# Patient Record
Sex: Male | Born: 1970 | Race: White | Hispanic: No | Marital: Married | Smoking: Former smoker
Health system: Southern US, Community
[De-identification: ages and names within clinical notes are randomized; demographics above are authoritative.]

## PROBLEM LIST (undated history)

## (undated) DIAGNOSIS — K5792 Diverticulitis of intestine, part unspecified, without perforation or abscess without bleeding: Secondary | ICD-10-CM

## (undated) DIAGNOSIS — I2119 ST elevation (STEMI) myocardial infarction involving other coronary artery of inferior wall: Secondary | ICD-10-CM

## (undated) DIAGNOSIS — I2510 Atherosclerotic heart disease of native coronary artery without angina pectoris: Secondary | ICD-10-CM

## (undated) DIAGNOSIS — Z95810 Presence of automatic (implantable) cardiac defibrillator: Secondary | ICD-10-CM

## (undated) DIAGNOSIS — E669 Obesity, unspecified: Secondary | ICD-10-CM

## (undated) DIAGNOSIS — I739 Peripheral vascular disease, unspecified: Secondary | ICD-10-CM

## (undated) DIAGNOSIS — I255 Ischemic cardiomyopathy: Secondary | ICD-10-CM

## (undated) DIAGNOSIS — G4733 Obstructive sleep apnea (adult) (pediatric): Secondary | ICD-10-CM

## (undated) DIAGNOSIS — F319 Bipolar disorder, unspecified: Secondary | ICD-10-CM

## (undated) DIAGNOSIS — I779 Disorder of arteries and arterioles, unspecified: Secondary | ICD-10-CM

## (undated) DIAGNOSIS — N179 Acute kidney failure, unspecified: Secondary | ICD-10-CM

## (undated) HISTORY — DX: Obstructive sleep apnea (adult) (pediatric): G47.33

## (undated) HISTORY — PX: OTHER SURGICAL HISTORY: SHX169

## (undated) HISTORY — DX: Obesity, unspecified: E66.9

## (undated) HISTORY — DX: Bipolar disorder, unspecified: F31.9

## (undated) HISTORY — DX: Ischemic cardiomyopathy: I25.5

## (undated) HISTORY — DX: Diverticulitis of intestine, part unspecified, without perforation or abscess without bleeding: K57.92

## (undated) HISTORY — DX: Atherosclerotic heart disease of native coronary artery without angina pectoris: I25.10

---

## 1998-11-04 DIAGNOSIS — I2119 ST elevation (STEMI) myocardial infarction involving other coronary artery of inferior wall: Secondary | ICD-10-CM

## 1998-11-04 DIAGNOSIS — I2510 Atherosclerotic heart disease of native coronary artery without angina pectoris: Secondary | ICD-10-CM

## 1998-11-04 HISTORY — DX: Atherosclerotic heart disease of native coronary artery without angina pectoris: I25.10

## 1998-11-04 HISTORY — PX: CORONARY ANGIOPLASTY WITH STENT PLACEMENT: SHX49

## 1998-11-04 HISTORY — DX: ST elevation (STEMI) myocardial infarction involving other coronary artery of inferior wall: I21.19

## 2013-10-20 ENCOUNTER — Ambulatory Visit (INDEPENDENT_AMBULATORY_CARE_PROVIDER_SITE_OTHER): Payer: BC Managed Care – PPO | Admitting: Cardiology

## 2013-10-20 ENCOUNTER — Encounter: Payer: Self-pay | Admitting: *Deleted

## 2013-10-20 ENCOUNTER — Encounter: Payer: Self-pay | Admitting: Cardiology

## 2013-10-20 VITALS — BP 130/80 | HR 76 | Ht 73.0 in | Wt 312.0 lb

## 2013-10-20 DIAGNOSIS — G4733 Obstructive sleep apnea (adult) (pediatric): Secondary | ICD-10-CM | POA: Insufficient documentation

## 2013-10-20 DIAGNOSIS — I429 Cardiomyopathy, unspecified: Secondary | ICD-10-CM | POA: Insufficient documentation

## 2013-10-20 DIAGNOSIS — Z9861 Coronary angioplasty status: Secondary | ICD-10-CM

## 2013-10-20 DIAGNOSIS — I2510 Atherosclerotic heart disease of native coronary artery without angina pectoris: Secondary | ICD-10-CM | POA: Insufficient documentation

## 2013-10-20 DIAGNOSIS — I2589 Other forms of chronic ischemic heart disease: Secondary | ICD-10-CM

## 2013-10-20 DIAGNOSIS — I255 Ischemic cardiomyopathy: Secondary | ICD-10-CM

## 2013-10-20 DIAGNOSIS — F319 Bipolar disorder, unspecified: Secondary | ICD-10-CM | POA: Insufficient documentation

## 2013-10-20 DIAGNOSIS — E669 Obesity, unspecified: Secondary | ICD-10-CM | POA: Insufficient documentation

## 2013-10-20 DIAGNOSIS — E785 Hyperlipidemia, unspecified: Secondary | ICD-10-CM

## 2013-10-20 MED ORDER — LISINOPRIL 2.5 MG PO TABS: 2.5000 mg | ORAL_TABLET | Freq: Every day | ORAL | Status: DC

## 2013-10-20 NOTE — Assessment & Plan Note (Signed)
Discussed weight loss. 

## 2013-10-20 NOTE — Assessment & Plan Note (Signed)
Based on outside records patient's LV function is reduced. Continue carvedilol at present dose. Discontinue Imdur as his blood pressure apparently is borderline at home. Add lisinopril 2.5 mg daily. Check potassium and renal function in one week. Schedule echocardiogram to assess LV function. If ejection fraction less than 35% would need to consider ICD.

## 2013-10-20 NOTE — Patient Instructions (Signed)
Your physician wants you to follow-up in: 6 MONTHS WITH DR Jens Som You will receive a reminder letter in the mail two months in advance. If you don't receive a letter, please call our office to schedule the follow-up appointment.   STOP ISOSORBIDE  START LISINOPRIL 2.5 MG ONCE DAILY  Your physician recommends that you return for lab work in: ONE WEEK- DO NOT EAT PRIOR TO LAB WORK  Your physician has requested that you have en exercise stress myoview. For further information please visit [email or web address] Please follow instruction sheet, as given.

## 2013-10-20 NOTE — Assessment & Plan Note (Signed)
Continue aspirin and statin. Schedule Myoview for risk stratification. 

## 2013-10-20 NOTE — Assessment & Plan Note (Signed)
Continue statin. Check lipids and liver. 

## 2013-10-20 NOTE — Progress Notes (Signed)
HPI: 42 year old male for evaluation of coronary artery disease. Patient previously cared for in [facility]. Patient has had a prior inferior posterior myocardial infarction. Cardiac catheterization in 2000 showed an occluded right coronary artery and patient had PCI at that time. Ejection fraction was 50%. Echocardiogram in 2009 showed an ejection fraction of 35-40% with akinesis of the inferior, inferior lateral and inferior septal walls. Moderate left atrial enlargement and mild mitral regurgitation. Previous monitor showed frequent PVCs. Carotid Dopplers in January 2010 showed less than 50% bilateral stenosis. Nuclear stress test in July of 2011 showed an ejection fraction of 34%. There was scar in the inferior apical wall. No ischemia. Patient denies dyspnea, chest pain, palpitations or syncope. No pedal edema. No claudication.  Current Outpatient Prescriptions  Medication Sig Dispense Refill  . aspirin 325 MG tablet Take 325 mg by mouth daily.      Marland Kitchen buPROPion (WELLBUTRIN XL) 150 MG 24 hr tablet Take 3 tablets by mouth daily.       . carvedilol (COREG) 25 MG tablet Take 1 tablet by mouth 2 (two) times daily.      . clonazePAM (KLONOPIN) 0.5 MG tablet Take 1 tablet by mouth daily.      Marland Kitchen FOLBEE 2.5-25-1 MG TABS tablet Take 1 tablet by mouth daily.      . furosemide (LASIX) 40 MG tablet Take 1 tablet by mouth as needed.       . isosorbide mononitrate (IMDUR) 30 MG 24 hr tablet 1/2 tab po qd      . lamoTRIgine (LAMICTAL) 200 MG tablet Take 1 tablet by mouth daily.      . modafinil (PROVIGIL) 200 MG tablet Take 1 tablet by mouth daily.      . nitroGLYCERIN (NITROSTAT) 0.4 MG SL tablet Place 0.4 mg under the tongue every 5 (five) minutes as needed for chest pain.      Marland Kitchen risperiDONE (RISPERDAL) 1 MG tablet Take 1 tablet by mouth daily.      . simvastatin (ZOCOR) 80 MG tablet Take 1 tablet by mouth daily.       No current facility-administered medications for this visit.    No Known  Allergies  Past Medical History  Diagnosis Date  . CAD (coronary artery disease)   . OSA (obstructive sleep apnea)   . Obesity   . Bipolar disorder   . Diverticulitis     Past Surgical History  Procedure Laterality Date  . Right elbow surgery      History   Social History  . Marital Status: Married    Spouse Name: N/A    Number of Children: 2  . Years of Education: N/A   Occupational History  .     Social History Main Topics  . Smoking status: Former Games developer  . Smokeless tobacco: Not on file  . Alcohol Use: Yes     Comment: Rarely  . Drug Use: Not on file  . Sexual Activity: Not on file   Other Topics Concern  . Not on file   Social History Narrative  . No narrative on file    Family History  Problem Relation Age of Onset  . CAD Mother     MI at age 32    ROS: no fevers or chills, productive cough, hemoptysis, dysphasia, odynophagia, melena, hematochezia, dysuria, hematuria, rash, seizure activity, orthopnea, PND, pedal edema, claudication. Remaining systems are negative.  Physical Exam:   Blood pressure 130/80, pulse 76, height 6\' 1"  (1.854 m),  weight 312 lb (141.522 kg).  General:  Well developed/obese in NAD Skin warm/dry Patient not depressed No peripheral clubbing Back-normal HEENT-normal/normal eyelids Neck supple/normal carotid upstroke bilaterally; no bruits; no JVD; no thyromegaly chest - CTA/ normal expansion CV - RRR/normal S1 and S2; no murmurs, rubs or gallops;  PMI nondisplaced Abdomen -NT/ND, no HSM, no mass, + bowel sounds, no bruit 2+ femoral pulses, no bruits Ext-trace edema, no chords, 2+ DP Neuro-grossly nonfocal  ECG sinus rhythm at a rate of 76. Prior inferior lateral infarct.

## 2013-10-25 ENCOUNTER — Other Ambulatory Visit: Payer: Self-pay | Admitting: *Deleted

## 2013-10-25 MED ORDER — SIMVASTATIN 80 MG PO TABS: 80.0000 mg | ORAL_TABLET | Freq: Every day | ORAL | Status: DC

## 2013-10-25 MED ORDER — FOLIC ACID-VIT B6-VIT B12 2.5-25-1 MG PO TABS: 1.0000 | ORAL_TABLET | Freq: Every day | ORAL | Status: DC

## 2013-10-25 MED ORDER — CARVEDILOL 25 MG PO TABS: 25.0000 mg | ORAL_TABLET | Freq: Two times a day (BID) | ORAL | Status: DC

## 2013-10-26 LAB — BASIC METABOLIC PANEL WITH GFR
CO2: 26 mEq/L (ref 19–32)
Chloride: 103 mEq/L (ref 96–112)
Glucose, Bld: 121 mg/dL — ABNORMAL HIGH (ref 70–99)
Potassium: 4.1 mEq/L (ref 3.5–5.3)
Sodium: 138 mEq/L (ref 135–145)

## 2013-10-26 LAB — HEPATIC FUNCTION PANEL
Albumin: 4 g/dL (ref 3.5–5.2)
Total Protein: 6.2 g/dL (ref 6.0–8.3)

## 2013-10-26 LAB — LIPID PANEL
HDL: 34 mg/dL — ABNORMAL LOW (ref >39)
LDL Cholesterol: 46 mg/dL (ref 0–99)
Total CHOL/HDL Ratio: 3.1 Ratio
Triglycerides: 135 mg/dL (ref <150)
VLDL: 27 mg/dL (ref 0–40)

## 2013-10-27 ENCOUNTER — Encounter: Payer: Self-pay | Admitting: *Deleted

## 2013-11-15 ENCOUNTER — Encounter: Payer: Self-pay | Admitting: Cardiology

## 2013-11-15 ENCOUNTER — Ambulatory Visit (HOSPITAL_COMMUNITY): Payer: BC Managed Care – PPO | Attending: Cardiology | Admitting: Radiology

## 2013-11-15 VITALS — BP 104/72 | HR 59 | Ht 73.0 in | Wt 303.0 lb

## 2013-11-15 DIAGNOSIS — I779 Disorder of arteries and arterioles, unspecified: Secondary | ICD-10-CM | POA: Insufficient documentation

## 2013-11-15 DIAGNOSIS — R002 Palpitations: Secondary | ICD-10-CM | POA: Insufficient documentation

## 2013-11-15 DIAGNOSIS — R0602 Shortness of breath: Secondary | ICD-10-CM | POA: Insufficient documentation

## 2013-11-15 DIAGNOSIS — I255 Ischemic cardiomyopathy: Secondary | ICD-10-CM

## 2013-11-15 DIAGNOSIS — I252 Old myocardial infarction: Secondary | ICD-10-CM | POA: Insufficient documentation

## 2013-11-15 DIAGNOSIS — Z8249 Family history of ischemic heart disease and other diseases of the circulatory system: Secondary | ICD-10-CM | POA: Insufficient documentation

## 2013-11-15 DIAGNOSIS — Z87891 Personal history of nicotine dependence: Secondary | ICD-10-CM | POA: Insufficient documentation

## 2013-11-15 DIAGNOSIS — I2510 Atherosclerotic heart disease of native coronary artery without angina pectoris: Secondary | ICD-10-CM

## 2013-11-15 DIAGNOSIS — E785 Hyperlipidemia, unspecified: Secondary | ICD-10-CM | POA: Insufficient documentation

## 2013-11-15 DIAGNOSIS — R0609 Other forms of dyspnea: Secondary | ICD-10-CM | POA: Insufficient documentation

## 2013-11-15 DIAGNOSIS — R0989 Other specified symptoms and signs involving the circulatory and respiratory systems: Principal | ICD-10-CM | POA: Insufficient documentation

## 2013-11-15 MED ORDER — REGADENOSON 0.4 MG/5ML IV SOLN
0.4000 mg | Freq: Once | INTRAVENOUS | Status: AC
  Administered 2013-11-15: 0.4 mg via INTRAVENOUS

## 2013-11-15 MED ORDER — TECHNETIUM TC 99M SESTAMIBI GENERIC - CARDIOLITE
30.0000 | Freq: Once | INTRAVENOUS | Status: AC | PRN
  Administered 2013-11-15: 30 via INTRAVENOUS

## 2013-11-15 NOTE — Progress Notes (Signed)
[facility] SITE 3 NUCLEAR MED [address] [place], [place] [address] [phone]    [facility]  Todd SakeJoseph Jackson is a 43 y.o. male     MRN : [id number]     DOB: 1971/08/21  Procedure Date: 11/15/2013  Nuclear Med Background Indication for Stress Test:  Evaluation for Ischemia, Stent Patency and Assess EF for possible ICD History:  MI, '02 RCA Stent, '09 Echo: EF=35-40%, and '11 Myocardial Perfusion Imaging-No ischemia, but infero-apical scar, EF=34% Cardiac Risk Factors: Carotid Disease, Family History - CAD, History of Smoking and Lipids  Symptoms:  DOE, Palpitations and SOB   Nuclear Pre-Procedure Caffeine/Decaff Intake:  11:00pm NPO After: 11:00pm   Lungs:  clear O2 Sat: 97% on room air. IV 0.9% NS with Angio Cath:  22g  IV Site: R Hand  IV Started by:  Cathlyn Parsonsynthia Hasspacher, RN  Chest Size (in):  52 Cup Size: n/a  Height: 6\' 1"  (1.854 m)  Weight:  303 lb (137.44 kg)  BMI:  Body mass index is 39.98 kg/(m^2). Tech Comments:  Coreg taken at 0700 this am    Nuclear Med Study 1 or 2 day study: 2 day  Stress Test Type:  Treadmill/Lexiscan  Reading MD: n/a  Order Authorizing Provider:  Ripley FraiseBrian Charae Depaolis,MD  Resting Radionuclide: Technetium 5730m Sestamibi  Resting Radionuclide Dose: 33.0 mCi  11/16/13  Stress Radionuclide:  Technetium 2130m Sestamibi  Stress Radionuclide Dose: 33.0 mCi  11/15/13          Stress Protocol Rest HR: 59 Stress HR: 110  Rest BP: 104/72 Stress BP: 123/69  Exercise Time (min): 2:00 METS: n/a   Predicted Max HR: 176 bpm % Max HR: 62.5 bpm Rate Pressure Product: [id number]   Dose of Adenosine (mg):  n/a Dose of Lexiscan: 0.4 mg  Dose of Atropine (mg): n/a Dose of Dobutamine: n/a mcg/kg/min (at max HR)  Stress Test Technologist: Irean HongPatsy Edwards, RN  Nuclear Technologist:  Doyne Keelonya Yount, CNMT     Rest Procedure:  Myocardial perfusion imaging was performed at rest 45 minutes following the intravenous administration of Technetium 1330m  Sestamibi. Rest ECG: Sinus bradycardia; inferior infarct.  Stress Procedure:  The patient received IV Lexiscan 0.4 mg over 15-seconds with concurrent low level exercise and then Technetium 630m Sestamibi was injected at 30-seconds while the patient continued walking one more minute.The patient complained of mild DOE,but denied chest pain.  Quantitative spect images were obtained after a 45-minute delay. Stress ECG: No significant ST segment change suggestive of ischemia.  QPS Raw Data Images:  Acquisition technically good; LVE Stress Images:  There is decreased uptake in the inferior wall. Rest Images:  There is decreased uptake in the inferior wall. Subtraction (SDS):  There is a fixed defect that is most consistent with a previous infarction. Transient Ischemic Dilatation (Normal <1.22):  1.05 Lung/Heart Ratio (Normal <0.45):  0.31  Quantitative Gated Spect Images QGS EDV:  198 ml QGS ESV:  131 ml  Impression Exercise Capacity:  Lexiscan with low level exercise. BP Response:  Normal blood pressure response. Clinical Symptoms:  There is dyspnea. ECG Impression:  No significant ST segment change suggestive of ischemia. Comparison with Prior Nuclear Study: No images to compare  Overall Impression:  High risk stress nuclear study with a large, severe intensity, fixed inferior defect consistent with prior inferior infarct; no ischemia; study high risk due to decreased LV function.  LV Ejection Fraction: 34%.  LV Wall Motion:  Inferior akinesis.   Olga MillersBrian Mana Morison

## 2013-11-16 ENCOUNTER — Encounter: Payer: Self-pay | Admitting: Cardiovascular Disease

## 2013-11-16 ENCOUNTER — Ambulatory Visit (HOSPITAL_COMMUNITY): Payer: BC Managed Care – PPO | Attending: Cardiovascular Disease

## 2013-11-16 DIAGNOSIS — R0989 Other specified symptoms and signs involving the circulatory and respiratory systems: Secondary | ICD-10-CM

## 2013-11-16 MED ORDER — TECHNETIUM TC 99M SESTAMIBI GENERIC - CARDIOLITE
33.0000 | Freq: Once | INTRAVENOUS | Status: AC | PRN
  Administered 2013-11-16: 33 via INTRAVENOUS

## 2013-11-29 ENCOUNTER — Encounter: Payer: Self-pay | Admitting: Cardiology

## 2013-11-29 ENCOUNTER — Ambulatory Visit (INDEPENDENT_AMBULATORY_CARE_PROVIDER_SITE_OTHER): Payer: BC Managed Care – PPO | Admitting: Cardiology

## 2013-11-29 VITALS — BP 116/52 | HR 75 | Ht 73.0 in | Wt 307.2 lb

## 2013-11-29 DIAGNOSIS — I255 Ischemic cardiomyopathy: Secondary | ICD-10-CM

## 2013-11-29 DIAGNOSIS — E785 Hyperlipidemia, unspecified: Secondary | ICD-10-CM

## 2013-11-29 DIAGNOSIS — I2589 Other forms of chronic ischemic heart disease: Secondary | ICD-10-CM

## 2013-11-29 MED ORDER — LISINOPRIL 5 MG PO TABS: 5.0000 mg | ORAL_TABLET | Freq: Every day | ORAL | Status: DC

## 2013-11-29 NOTE — Addendum Note (Signed)
Addended by: Freddi StarrMATHIS, Harue Pribble W on: 11/29/2013 11:47 AM   Modules accepted: Orders

## 2013-11-29 NOTE — Assessment & Plan Note (Signed)
Continue statin. 

## 2013-11-29 NOTE — Assessment & Plan Note (Signed)
Continue aspirin and statin. Nuclear study showed no ischemia.

## 2013-11-29 NOTE — Patient Instructions (Signed)
Your physician wants you to follow-up in: 3 MONTHS WITH DR Jens SomRENSHAW You will receive a reminder letter in the mail two months in advance. If you don't receive a letter, please call our office to schedule the follow-up appointment.   REFERRAL TO EP TO DISCUSS ICD  INCREASE LISINOPRIL TO 5 MG ONCE DAILY  Your physician recommends that you return for lab work in: ONE WEEK

## 2013-11-29 NOTE — Progress Notes (Signed)
HPI: FU coronary artery disease. [facility] Patient has had a prior inferior posterior myocardial infarction. Cardiac catheterization in 2000 showed an occluded right coronary artery and patient had PCI at that time. Ejection fraction was 50%. Echocardiogram in 2009 showed an ejection fraction of 35-40% with akinesis of the inferior, inferior lateral and inferior septal walls. Moderate left atrial enlargement and mild mitral regurgitation. Previous monitor showed frequent PVCs. Carotid Dopplers in January 2010 showed less than 50% bilateral stenosis. Nuclear stress test in January of 2015 showed a prior inferior infarct with no ischemia.  Ejection fraction 34%. Since I saw him, the patient has dyspnea with more extreme activities but not with routine activities. It is relieved with rest. It is not associated with chest pain. There is no orthopnea, PND or pedal edema. There is no syncope or palpitations. There is no exertional chest pain.    Current Outpatient Prescriptions  Medication Sig Dispense Refill  . aspirin 325 MG tablet Take 325 mg by mouth daily.      Marland Kitchen. buPROPion (WELLBUTRIN XL) 150 MG 24 hr tablet Take 3 tablets by mouth daily.       . carvedilol (COREG) 25 MG tablet Take 1 tablet (25 mg total) by mouth 2 (two) times daily.  180 tablet  1  . clonazePAM (KLONOPIN) 0.5 MG tablet Take 1 tablet by mouth daily.      . Folic Acid-Vit B6-Vit B12 (FOLBEE) 2.5-25-1 MG TABS tablet Take 1 tablet by mouth daily.  90 tablet  1  . furosemide (LASIX) 40 MG tablet Take 1 tablet by mouth as needed.       . lamoTRIgine (LAMICTAL) 200 MG tablet Take 1 tablet by mouth daily.      Marland Kitchen. lisinopril (PRINIVIL,ZESTRIL) 2.5 MG tablet Take 1 tablet (2.5 mg total) by mouth daily.  90 tablet  3  . modafinil (PROVIGIL) 200 MG tablet Take 1 tablet by mouth daily.      . nitroGLYCERIN (NITROSTAT) 0.4 MG SL tablet Place 0.4 mg under the tongue every 5 (five) minutes as needed for  chest pain.      Marland Kitchen. risperiDONE (RISPERDAL) 1 MG tablet Take 1 tablet by mouth daily.      . simvastatin (ZOCOR) 80 MG tablet Take 1 tablet (80 mg total) by mouth daily.  90 tablet  1  . zaleplon (SONATA) 10 MG capsule Take 10 mg by mouth at bedtime as needed.        No current facility-administered medications for this visit.     Past Medical History  Diagnosis Date  . CAD (coronary artery disease)   . OSA (obstructive sleep apnea)   . Obesity   . Bipolar disorder   . Diverticulitis     Past Surgical History  Procedure Laterality Date  . Right elbow surgery      History   Social History  . Marital Status: Married    Spouse Name: N/A    Number of Children: 2  . Years of Education: N/A   Occupational History  .     Social History Main Topics  . Smoking status: Former Games developermoker  . Smokeless tobacco: Not on file  . Alcohol Use: Yes     Comment: Rarely  . Drug Use: Not on file  . Sexual Activity: Not on file   Other Topics Concern  . Not on file   Social History Narrative  . No narrative on file  ROS: no fevers or chills, productive cough, hemoptysis, dysphasia, odynophagia, melena, hematochezia, dysuria, hematuria, rash, seizure activity, orthopnea, PND, pedal edema, claudication. Remaining systems are negative.  Physical Exam: Well-developed obese in no acute distress.  Skin is warm and dry.  HEENT is normal.  Neck is supple.  Chest is clear to auscultation with normal expansion.  Cardiovascular exam is regular rate and rhythm.  Abdominal exam nontender or distended. No masses palpated. Extremities show no edema. neuro grossly intact

## 2013-11-29 NOTE — Assessment & Plan Note (Signed)
Continue beta blocker. Increase lisinopril to 5 mg daily. Check potassium and renal function in one week. I will consider spironolactone in the future. Ejection fraction is less than 35%. I will arrange an evaluation by electrophysiology for consideration of ICD.

## 2013-12-06 ENCOUNTER — Other Ambulatory Visit: Payer: BC Managed Care – PPO

## 2013-12-06 LAB — BASIC METABOLIC PANEL WITH GFR
BUN: 13 mg/dL (ref 6–23)
CO2: 23 meq/L (ref 19–32)
CREATININE: 1 mg/dL (ref 0.50–1.35)
Calcium: 9.2 mg/dL (ref 8.4–10.5)
Chloride: 103 mEq/L (ref 96–112)
GFR, Est African American: >89 mL/min
GFR, Est Non African American: >89 mL/min
GLUCOSE: 118 mg/dL — AB (ref 70–99)
Potassium: 4.3 mEq/L (ref 3.5–5.3)
SODIUM: 137 meq/L (ref 135–145)

## 2013-12-14 ENCOUNTER — Encounter: Payer: Self-pay | Admitting: Internal Medicine

## 2013-12-14 ENCOUNTER — Ambulatory Visit (INDEPENDENT_AMBULATORY_CARE_PROVIDER_SITE_OTHER): Payer: BC Managed Care – PPO | Admitting: Internal Medicine

## 2013-12-14 VITALS — BP 116/72 | HR 68 | Ht 73.0 in | Wt 305.4 lb

## 2013-12-14 DIAGNOSIS — I2510 Atherosclerotic heart disease of native coronary artery without angina pectoris: Secondary | ICD-10-CM

## 2013-12-14 DIAGNOSIS — I2589 Other forms of chronic ischemic heart disease: Secondary | ICD-10-CM

## 2013-12-14 DIAGNOSIS — I255 Ischemic cardiomyopathy: Secondary | ICD-10-CM

## 2013-12-14 MED ORDER — LOSARTAN POTASSIUM 25 MG PO TABS: 25.0000 mg | ORAL_TABLET | Freq: Every day | ORAL | Status: DC

## 2013-12-14 NOTE — Patient Instructions (Signed)
Your physician recommends that you schedule a follow-up appointment as needed with Dr Ladona Ridgelaylor and as scheduled with Dr Jens Somrenshaw  Your physician has recommended you make the following change in your medication:  1) Stop Lisinopril 2) Losartan 25mg  daily

## 2013-12-15 ENCOUNTER — Encounter: Payer: Self-pay | Admitting: Internal Medicine

## 2013-12-15 NOTE — Assessment & Plan Note (Signed)
The patient has an EF of 34% by nuclear scan. He has class 1 CHF symptoms on my history. He does not meet candidacy for ICD implant as his EF would need to be 30% or less with class 1 symptoms or his heart failure class 2 with an EF of 34%. I would recommend watchful waiting. If he has syncope, I instructed the patient to go to the ER. Would recheck his EF by echo or nuclear in the next 6-12 months. As the patient has had trouble with his ACE inhibitor, I have switched him to losartan.  He will follow up with Dr. Jens Somrenshaw.

## 2013-12-15 NOTE — Progress Notes (Signed)
HPI [name] is referred by Dr. Jens Somrenshaw for consideration for ICD implantation. He is a pleasant 43 yo man with an ICM, s/p MI in 2000. He has had a reduction in his EF from 40% to 34% over the past year. Despite this, he has not had chest pain or sob. He works full time in a very strenuous job, carrying heavy objects and walking up stairs. He is tired at the end of the day but so are his coworkers by his report. He has not had syncope. He is able to mow his own lawn with a push mower. He has had trouble taking his lisinopril, being bothered by a cough.  No Known Allergies   Current Outpatient Prescriptions  Medication Sig Dispense Refill  . aspirin 325 MG tablet Take 325 mg by mouth daily.      Marland Kitchen. buPROPion (WELLBUTRIN XL) 150 MG 24 hr tablet Take 3 tablets by mouth daily.       . carvedilol (COREG) 25 MG tablet Take 1 tablet (25 mg total) by mouth 2 (two) times daily.  180 tablet  1  . clonazePAM (KLONOPIN) 0.5 MG tablet Take 1 tablet by mouth as needed.       . Folic Acid-Vit B6-Vit B12 (FOLBEE) 2.5-25-1 MG TABS tablet Take 1 tablet by mouth daily.  90 tablet  1  . furosemide (LASIX) 40 MG tablet Take 1 tablet by mouth as needed.       . lamoTRIgine (LAMICTAL) 200 MG tablet Take 1 tablet by mouth daily.      . modafinil (PROVIGIL) 200 MG tablet Take 1 tablet by mouth daily.      . nitroGLYCERIN (NITROSTAT) 0.4 MG SL tablet Place 0.4 mg under the tongue every 5 (five) minutes as needed for chest pain.      Marland Kitchen. risperiDONE (RISPERDAL) 1 MG tablet Take 1 tablet in the morning and 2 tablets at night      . simvastatin (ZOCOR) 80 MG tablet Take 1 tablet (80 mg total) by mouth daily.  90 tablet  1  . zaleplon (SONATA) 10 MG capsule Take 10 mg by mouth at bedtime as needed.       Marland Kitchen. losartan (COZAAR) 25 MG tablet Take 1 tablet (25 mg total) by mouth daily.  90 tablet  3   No current facility-administered medications for this visit.     Past Medical History  Diagnosis Date  . CAD  (coronary artery disease)   . OSA (obstructive sleep apnea)   . Obesity   . Bipolar disorder   . Diverticulitis     ROS:   All systems reviewed and negative except as noted in the HPI.   Past Surgical History  Procedure Laterality Date  . Right elbow surgery       Family History  Problem Relation Age of Onset  . CAD Mother     MI at age 45     History   Social History  . Marital Status: Married    Spouse Name: N/A    Number of Children: 2  . Years of Education: N/A   Occupational History  .     Social History Main Topics  . Smoking status: Former Games developermoker  . Smokeless tobacco: Not on file  . Alcohol Use: Yes     Comment: Rarely  . Drug Use: Not on file  . Sexual Activity: Not on file   Other Topics Concern  . Not on file  Social History Narrative  . No narrative on file     BP 116/72  Pulse 68  Ht 6\' 1"  (1.854 m)  Wt 305 lb 6.4 oz (138.529 kg)  BMI 40.30 kg/m2  Physical Exam:  Obese apearring middle aged man, NAD HEENT: Unremarkable Neck:  6 cm JVD, no thyromegally Back:  No CVA tenderness Lungs:  Clear with no wheezes HEART:  Regular rate rhythm, no murmurs, no rubs, no clicks Abd:  soft, positive bowel sounds, no organomegally, no rebound, no guarding Ext:  2 plus pulses, no edema, no cyanosis, no clubbing Skin:  No rashes no nodules Neuro:  CN II through XII intact, motor grossly intact  EKG - NSR   Assess/Plan:

## 2014-01-07 ENCOUNTER — Other Ambulatory Visit: Payer: Self-pay

## 2014-01-07 MED ORDER — FUROSEMIDE 40 MG PO TABS: 40.0000 mg | ORAL_TABLET | ORAL | Status: DC | PRN

## 2014-01-07 MED ORDER — FOLIC ACID-VIT B6-VIT B12 2.5-25-1 MG PO TABS: 1.0000 | ORAL_TABLET | Freq: Every day | ORAL | Status: DC

## 2014-03-23 ENCOUNTER — Encounter: Payer: Self-pay | Admitting: Cardiology

## 2014-03-23 ENCOUNTER — Ambulatory Visit (INDEPENDENT_AMBULATORY_CARE_PROVIDER_SITE_OTHER): Payer: BC Managed Care – PPO | Admitting: Cardiology

## 2014-03-23 VITALS — BP 136/80 | HR 70 | Ht 73.0 in | Wt 314.0 lb

## 2014-03-23 DIAGNOSIS — I255 Ischemic cardiomyopathy: Secondary | ICD-10-CM

## 2014-03-23 DIAGNOSIS — E785 Hyperlipidemia, unspecified: Secondary | ICD-10-CM

## 2014-03-23 DIAGNOSIS — I2510 Atherosclerotic heart disease of native coronary artery without angina pectoris: Secondary | ICD-10-CM

## 2014-03-23 DIAGNOSIS — I2589 Other forms of chronic ischemic heart disease: Secondary | ICD-10-CM

## 2014-03-23 MED ORDER — LOSARTAN POTASSIUM 50 MG PO TABS: 50.0000 mg | ORAL_TABLET | Freq: Every day | ORAL | Status: DC

## 2014-03-23 NOTE — Assessment & Plan Note (Signed)
Patient was seen by Dr. Ladona Ridgelaylor. He did not think ICD was indicated at this point. Continue beta blocker. Increase Cozaar to 50 mg daily. I've asked him to follow his blood pressure at home. If his systolic blood pressure averages greater than 110 we will increase Cozaar to 100 mg daily. We will most likely repeat his echocardiogram in 6 months when he returns after medications fully titrated. If ejection fraction falls further he will need ICD.

## 2014-03-23 NOTE — Assessment & Plan Note (Signed)
Continue aspirin and statin. 

## 2014-03-23 NOTE — Assessment & Plan Note (Signed)
Continue statin. 

## 2014-03-23 NOTE — Patient Instructions (Signed)
Your physician wants you to follow-up in: 6 MONTHS WITH DR Jens SomRENSHAW You will receive a reminder letter in the mail two months in advance. If you don't receive a letter, please call our office to schedule the follow-up appointment.   INCREASE LOSARTAN TO 50 MG ONCE DAILY= 2 25 MG TABLETS ONCE DAILY  TRACK BLOOD PRESSURE AND CALL IF CONSISTENTLY ELEVATED ABOVE 110 ON THE BOTTOM  Your physician has requested that you have an echocardiogram. Echocardiography is a painless test that uses sound waves to create images of your heart. It provides your doctor with information about the size and shape of your heart and how well your heart's chambers and valves are working. This procedure takes approximately one hour. There are no restrictions for this procedure.PRIOR TO APPT IN 6 MONTHS

## 2014-03-23 NOTE — Progress Notes (Signed)
HPI: FU coronary artery disease. Patient previously cared for in [facility]. Patient has had a prior inferior posterior myocardial infarction. Cardiac catheterization in 2000 showed an occluded right coronary artery and patient had PCI at that time. Ejection fraction was 50%. Echocardiogram in 2009 showed an ejection fraction of 35-40% with akinesis of the inferior, inferior lateral and inferior septal walls. Moderate left atrial enlargement and mild mitral regurgitation. Previous monitor showed frequent PVCs. Carotid Dopplers in January 2010 showed less than 50% bilateral stenosis. Nuclear stress test in January of 2015 showed a prior inferior infarct with no ischemia. Ejection fraction 34%. Patient referred to electrophysiology for consideration of ICD. However Dr. Lovena Le did not feel he met criteria for ICD at this point. Since he was last seen the patient has dyspnea with more extreme activities but not with routine activities. It is relieved with rest. It is not associated with chest pain. There is no orthopnea, PND or pedal edema. There is no syncope or palpitations. There is no exertional chest pain.    Current Outpatient Prescriptions  Medication Sig Dispense Refill  . aspirin 325 MG tablet Take 325 mg by mouth daily.      Marland Kitchen buPROPion (WELLBUTRIN XL) 150 MG 24 hr tablet Take 3 tablets by mouth daily.       . carvedilol (COREG) 25 MG tablet Take 1 tablet (25 mg total) by mouth 2 (two) times daily.  180 tablet  1  . clonazePAM (KLONOPIN) 0.5 MG tablet Take 1 tablet by mouth as needed.       . Folic Acid-Vit X0-XYB F38 (FOLBEE) 2.5-25-1 MG TABS tablet Take 1 tablet by mouth daily.  90 tablet  0  . furosemide (LASIX) 40 MG tablet Take 1 tablet (40 mg total) by mouth as needed.  90 tablet  0  . lamoTRIgine (LAMICTAL) 200 MG tablet Take 1 tablet by mouth daily.      Marland Kitchen losartan (COZAAR) 25 MG tablet Take 1 tablet (25 mg total) by mouth daily.  90 tablet  3  . modafinil (PROVIGIL) 200 MG  tablet Take 1 tablet by mouth daily.      . nitroGLYCERIN (NITROSTAT) 0.4 MG SL tablet Place 0.4 mg under the tongue every 5 (five) minutes as needed for chest pain.      Marland Kitchen risperiDONE (RISPERDAL) 1 MG tablet Take 1 tablet in the morning and 2 tablets at night      . simvastatin (ZOCOR) 80 MG tablet Take 1 tablet (80 mg total) by mouth daily.  90 tablet  1  . zaleplon (SONATA) 10 MG capsule Take 10 mg by mouth at bedtime as needed.        No current facility-administered medications for this visit.     Past Medical History  Diagnosis Date  . CAD (coronary artery disease)   . OSA (obstructive sleep apnea)   . Obesity   . Bipolar disorder   . Diverticulitis     Past Surgical History  Procedure Laterality Date  . Right elbow surgery      History   Social History  . Marital Status: Married    Spouse Name: N/A    Number of Children: 2  . Years of Education: N/A   Occupational History  .     Social History Main Topics  . Smoking status: Former Research scientist (life sciences)  . Smokeless tobacco: Not on file  . Alcohol Use: Yes     Comment: Rarely  . Drug Use: Not  on file  . Sexual Activity: Not on file   Other Topics Concern  . Not on file   Social History Narrative  . No narrative on file    ROS: no fevers or chills, productive cough, hemoptysis, dysphasia, odynophagia, melena, hematochezia, dysuria, hematuria, rash, seizure activity, orthopnea, PND, pedal edema, claudication. Remaining systems are negative.  Physical Exam: Well-developed obese in no acute distress.  Skin is warm and dry.  HEENT is normal.  Neck is supple.  Chest is clear to auscultation with normal expansion.  Cardiovascular exam is regular rate and rhythm.  Abdominal exam nontender or distended. No masses palpated. Extremities show no edema. neuro grossly intact  ECG Sinus rhythm at a rate of 70. Occasional PVCs. Prior inferior infarct.

## 2014-07-05 ENCOUNTER — Encounter: Payer: Self-pay | Admitting: Cardiology

## 2014-07-05 DIAGNOSIS — E785 Hyperlipidemia, unspecified: Secondary | ICD-10-CM

## 2014-07-05 DIAGNOSIS — I255 Ischemic cardiomyopathy: Secondary | ICD-10-CM

## 2014-07-05 DIAGNOSIS — I2510 Atherosclerotic heart disease of native coronary artery without angina pectoris: Secondary | ICD-10-CM

## 2014-07-06 ENCOUNTER — Other Ambulatory Visit: Payer: Self-pay | Admitting: *Deleted

## 2014-07-06 MED ORDER — SIMVASTATIN 80 MG PO TABS: 80.0000 mg | ORAL_TABLET | Freq: Every day | ORAL | Status: DC

## 2014-07-06 MED ORDER — CARVEDILOL 25 MG PO TABS: 25.0000 mg | ORAL_TABLET | Freq: Two times a day (BID) | ORAL | Status: DC

## 2014-07-06 MED ORDER — FOLIC ACID-VIT B6-VIT B12 2.5-25-1 MG PO TABS: 1.0000 | ORAL_TABLET | Freq: Every day | ORAL | Status: DC

## 2014-07-07 ENCOUNTER — Telehealth: Payer: Self-pay | Admitting: Cardiology

## 2014-07-07 ENCOUNTER — Other Ambulatory Visit: Payer: Self-pay

## 2014-07-07 ENCOUNTER — Other Ambulatory Visit: Payer: Self-pay | Admitting: *Deleted

## 2014-07-07 DIAGNOSIS — I2510 Atherosclerotic heart disease of native coronary artery without angina pectoris: Secondary | ICD-10-CM

## 2014-07-07 DIAGNOSIS — I429 Cardiomyopathy, unspecified: Secondary | ICD-10-CM

## 2014-07-07 DIAGNOSIS — I255 Ischemic cardiomyopathy: Secondary | ICD-10-CM

## 2014-07-07 DIAGNOSIS — E785 Hyperlipidemia, unspecified: Secondary | ICD-10-CM

## 2014-07-07 MED ORDER — LOSARTAN POTASSIUM 100 MG PO TABS: 100.0000 mg | ORAL_TABLET | Freq: Every day | ORAL | Status: DC

## 2014-07-07 MED ORDER — NITROGLYCERIN 0.4 MG SL SUBL
0.4000 mg | SUBLINGUAL_TABLET | SUBLINGUAL | Status: DC | PRN
Start: 2014-07-07 — End: 2014-12-14

## 2014-07-07 NOTE — Telephone Encounter (Signed)
Spoke with pt wife, echo scheduled per dr Ludwig Clarks last office note.

## 2014-07-07 NOTE — Telephone Encounter (Signed)
Left message for pt to call to discuss medicine change and need for repeat labs

## 2014-07-07 NOTE — Telephone Encounter (Signed)
New problem   Pt want to know if he need an echocardiogram b/f his appt as Dr Jens Som had stated during his last visit.

## 2014-09-04 ENCOUNTER — Other Ambulatory Visit: Payer: Self-pay

## 2014-09-04 MED ORDER — CARVEDILOL 25 MG PO TABS: 25.0000 mg | ORAL_TABLET | Freq: Two times a day (BID) | ORAL | Status: DC

## 2014-09-04 MED ORDER — SIMVASTATIN 80 MG PO TABS: 80.0000 mg | ORAL_TABLET | Freq: Every day | ORAL | Status: DC

## 2014-09-04 MED ORDER — FOLIC ACID-VIT B6-VIT B12 2.5-25-1 MG PO TABS: 1.0000 | ORAL_TABLET | Freq: Every day | ORAL | Status: DC

## 2014-10-04 ENCOUNTER — Ambulatory Visit (HOSPITAL_COMMUNITY): Payer: BC Managed Care – PPO | Attending: Cardiology

## 2014-10-04 ENCOUNTER — Other Ambulatory Visit (HOSPITAL_COMMUNITY): Payer: BC Managed Care – PPO | Admitting: Cardiology

## 2014-10-04 DIAGNOSIS — I255 Ischemic cardiomyopathy: Secondary | ICD-10-CM

## 2014-10-04 DIAGNOSIS — I429 Cardiomyopathy, unspecified: Secondary | ICD-10-CM | POA: Diagnosis present

## 2014-10-04 HISTORY — DX: Ischemic cardiomyopathy: I25.5

## 2014-10-04 MED ORDER — PERFLUTREN LIPID MICROSPHERE
1.0000 mL | Freq: Once | INTRAVENOUS | Status: AC
  Administered 2014-10-04: 1 mL via INTRAVENOUS

## 2014-10-04 NOTE — Progress Notes (Signed)
2D Echo with Definity completed. Patient had headache (1 of 10 pain) after administering Definity.  10/04/2014

## 2014-10-04 NOTE — Progress Notes (Signed)
Patient complained of headache 1/10 for 45 second duration after Definity IVP given per patient.  Vital signs: BP 126/80, HR 63, Resp 16, and O2 sat 97% RA.  Rechecked at 30 minutes BP 120/80, HR 60, Resp 16, and O2 Sat 97% RA, patient asymptomatic IV discontinued, site unremarkable, tolerated well. Patient discharged stable condition.Irean HongPatsy Heman Que, RN.

## 2014-10-11 NOTE — Progress Notes (Signed)
HPI: FU coronary artery disease. Patient previously cared for in [facility]. Patient has had a prior inferior posterior myocardial infarction. Cardiac catheterization in 2000 showed an occluded right coronary artery and patient had PCI at that time. Ejection fraction was 50%. Previous monitor showed frequent PVCs. Carotid Dopplers in January 2010 showed less than 50% bilateral stenosis. Nuclear stress test in January of 2015 showed a prior inferior infarct with no ischemia. Ejection fraction 34%. Patient referred to electrophysiology for consideration of ICD. However Dr. Lovena Le did not feel he met criteria for ICD at that point. Echo repeated 12/15 and showed EF 25, moderate LAE and mild MR. Since he was last seen He has some dyspnea with more moderate activities. No orthopnea, PND, pedal edema, syncope. He occasionally has a brief chest pain with vigorous activities for 1-2 seconds.  Current Outpatient Prescriptions  Medication Sig Dispense Refill  . aspirin 325 MG tablet Take 325 mg by mouth daily.    Marland Kitchen buPROPion (WELLBUTRIN XL) 150 MG 24 hr tablet Take 3 tablets by mouth daily.     . carvedilol (COREG) 25 MG tablet Take 1 tablet (25 mg total) by mouth 2 (two) times daily. 180 tablet 0  . clonazePAM (KLONOPIN) 0.5 MG tablet Take 1 tablet by mouth as needed.     . Folic Acid-Vit W5-YKD X83 (FOLBEE) 2.5-25-1 MG TABS tablet Take 1 tablet by mouth daily. 90 tablet 0  . furosemide (LASIX) 40 MG tablet Take 1 tablet (40 mg total) by mouth as needed. 90 tablet 0  . lamoTRIgine (LAMICTAL) 200 MG tablet Take 1 tablet by mouth daily.    Marland Kitchen losartan (COZAAR) 100 MG tablet Take 1 tablet (100 mg total) by mouth daily. 90 tablet 3  . metFORMIN (GLUCOPHAGE) 1000 MG tablet Take 1,000 mg by mouth 2 (two) times daily.  0  . modafinil (PROVIGIL) 200 MG tablet Take 1 tablet by mouth daily.    . Montelukast Sodium (SINGULAIR PO) Take 10 mg by mouth daily.     . nitroGLYCERIN (NITROSTAT) 0.4 MG SL tablet Place 1  tablet (0.4 mg total) under the tongue every 5 (five) minutes as needed for chest pain. 25 tablet 3  . Oxcarbazepine (TRILEPTAL) 300 MG tablet Take 300 mg by mouth. 1 tablet in the AM and 3 at night  0  . prazosin (MINIPRESS) 5 MG capsule Take 5 mg by mouth 2 (two) times daily.   0  . PROAIR HFA 108 (90 BASE) MCG/ACT inhaler as needed. Asthma  1  . risperiDONE (RISPERDAL) 1 MG tablet Take 1 tablet in the morning and 2 tablets at night    . simvastatin (ZOCOR) 80 MG tablet Take 1 tablet (80 mg total) by mouth daily. 90 tablet 0  . zaleplon (SONATA) 10 MG capsule Take 10 mg by mouth at bedtime as needed.      No current facility-administered medications for this visit.     Past Medical History  Diagnosis Date  . CAD (coronary artery disease)   . OSA (obstructive sleep apnea)   . Obesity   . Bipolar disorder   . Diverticulitis   . Ischemic cardiomyopathy     Past Surgical History  Procedure Laterality Date  . Right elbow surgery      History   Social History  . Marital Status: Married    Spouse Name: N/A    Number of Children: 2  . Years of Education: N/A   Occupational History  .  Social History Main Topics  . Smoking status: Former Research scientist (life sciences)  . Smokeless tobacco: Not on file  . Alcohol Use: Yes     Comment: Rarely  . Drug Use: Not on file  . Sexual Activity: Not on file   Other Topics Concern  . Not on file   Social History Narrative    ROS: no fevers or chills, productive cough, hemoptysis, dysphasia, odynophagia, melena, hematochezia, dysuria, hematuria, rash, seizure activity, orthopnea, PND, pedal edema, claudication. Remaining systems are negative.  Physical Exam: Well-developed well-nourished in no acute distress.  Skin is warm and dry.  HEENT is normal.  Neck is supple.  Chest is clear to auscultation with normal expansion.  Cardiovascular exam is regular rate and rhythm.  Abdominal exam nontender or distended. No masses palpated. Extremities show  no edema. neuro grossly intact  ECG Normal sinus rhythm at a rate of 79. Inferior infarct.

## 2014-10-12 ENCOUNTER — Encounter: Payer: Self-pay | Admitting: Cardiology

## 2014-10-12 ENCOUNTER — Other Ambulatory Visit: Payer: Self-pay | Admitting: Cardiology

## 2014-10-12 ENCOUNTER — Ambulatory Visit (INDEPENDENT_AMBULATORY_CARE_PROVIDER_SITE_OTHER): Payer: BC Managed Care – PPO | Admitting: Cardiology

## 2014-10-12 ENCOUNTER — Telehealth: Payer: Self-pay | Admitting: Cardiology

## 2014-10-12 ENCOUNTER — Encounter: Payer: Self-pay | Admitting: *Deleted

## 2014-10-12 VITALS — BP 122/80 | HR 79 | Ht 73.0 in | Wt 304.0 lb

## 2014-10-12 DIAGNOSIS — R072 Precordial pain: Secondary | ICD-10-CM

## 2014-10-12 DIAGNOSIS — E785 Hyperlipidemia, unspecified: Secondary | ICD-10-CM

## 2014-10-12 DIAGNOSIS — I255 Ischemic cardiomyopathy: Secondary | ICD-10-CM

## 2014-10-12 DIAGNOSIS — I2510 Atherosclerotic heart disease of native coronary artery without angina pectoris: Secondary | ICD-10-CM

## 2014-10-12 DIAGNOSIS — I2583 Coronary atherosclerosis due to lipid rich plaque: Principal | ICD-10-CM

## 2014-10-12 LAB — CBC
HCT: 39.1 % (ref 39.0–52.0)
HEMOGLOBIN: 13.7 g/dL (ref 13.0–17.0)
MCH: 30.1 pg (ref 26.0–34.0)
MCHC: 35 g/dL (ref 30.0–36.0)
MCV: 85.9 fL (ref 78.0–100.0)
MPV: 9.5 fL (ref 9.4–12.4)
PLATELETS: 233 10*3/uL (ref 150–400)
RBC: 4.55 MIL/uL (ref 4.22–5.81)
RDW: 14.5 % (ref 11.5–15.5)
WBC: 7.9 10*3/uL (ref 4.0–10.5)

## 2014-10-12 MED ORDER — SPIRONOLACTONE 25 MG PO TABS: 25.0000 mg | ORAL_TABLET | Freq: Every day | ORAL | Status: DC

## 2014-10-12 NOTE — Telephone Encounter (Signed)
Please call,having a cath tomorrow. Pt wants to know about stopping one if his medicine. Please call asap,If not there please call his cell [phone]-[phone].

## 2014-10-12 NOTE — Assessment & Plan Note (Signed)
Continue aspirin and statin. 

## 2014-10-12 NOTE — Assessment & Plan Note (Addendum)
Continue ARB and beta blocker. Add spironolactone 25 mg daily. Check potassium and renal function in 1 week. Based on echocardiogram his LV function has deteriorated and is now 25-30%. Plan to proceed with cardiac catheterization to exclude progressive coronary disease and need for revascularization. The risks and benefits were discussed and he agrees to proceed. Hold glucophage for 48 hrs following cath. If he has coronary artery disease that requires revascularization we would plan to repeat his echocardiogram 3 months later to see if his LV function has improved. If he does not have significant coronary disease and he will see Dr. Ladona Ridgelaylor back for consideration of ICD.

## 2014-10-12 NOTE — Assessment & Plan Note (Signed)
Continue statin. 

## 2014-10-12 NOTE — Telephone Encounter (Signed)
Returned call to patient he stated he wanted to let Dr.Crenshaw's nurse know Gabapentin 300 mg was not on his medication list this morning.Stated he is scheduled to have a cardiac cath tomorrow and he wanted to make sure ok to continue to take.Advised ok to keep taking Gabapentin 300 mg three times a day.Does not have to hold for cath.Stated he understands to hold metformin tonight and 2 days after cath.Message sent to Dr.Crenshaw's nurse Deliah Goodyebra Mathis RN.

## 2014-10-12 NOTE — Patient Instructions (Signed)
Your physician recommends that you schedule a follow-up appointment in: 8 WEEKS WITH DR CRENSHAW  START SPIRONOLACTONE 25 MG ONCE DAILY  Your physician recommends that you HAVE LAB WORK TODAY AND IN ONE WEEK  REFERRAL TO DR Sharlot GowdaGREGG TAYLOR TO DISCUSS ICD

## 2014-10-13 ENCOUNTER — Ambulatory Visit (HOSPITAL_COMMUNITY)
Admission: RE | Admit: 2014-10-13 | Discharge: 2014-10-13 | Disposition: A | Discharge status: Home / Self Care | Payer: BC Managed Care – PPO | Source: Ambulatory Visit | Attending: Interventional Cardiology | Admitting: Interventional Cardiology

## 2014-10-13 ENCOUNTER — Encounter (HOSPITAL_COMMUNITY)
Admission: RE | Disposition: A | Discharge status: Home / Self Care | Payer: Self-pay | Source: Ambulatory Visit | Attending: Interventional Cardiology

## 2014-10-13 DIAGNOSIS — R29898 Other symptoms and signs involving the musculoskeletal system: Secondary | ICD-10-CM | POA: Diagnosis not present

## 2014-10-13 DIAGNOSIS — Z7982 Long term (current) use of aspirin: Secondary | ICD-10-CM | POA: Diagnosis not present

## 2014-10-13 DIAGNOSIS — Z6841 Body Mass Index (BMI) 40.0 and over, adult: Secondary | ICD-10-CM | POA: Diagnosis not present

## 2014-10-13 DIAGNOSIS — E669 Obesity, unspecified: Secondary | ICD-10-CM | POA: Diagnosis not present

## 2014-10-13 DIAGNOSIS — I252 Old myocardial infarction: Secondary | ICD-10-CM | POA: Insufficient documentation

## 2014-10-13 DIAGNOSIS — I2510 Atherosclerotic heart disease of native coronary artery without angina pectoris: Secondary | ICD-10-CM | POA: Insufficient documentation

## 2014-10-13 DIAGNOSIS — I429 Cardiomyopathy, unspecified: Secondary | ICD-10-CM

## 2014-10-13 DIAGNOSIS — I255 Ischemic cardiomyopathy: Secondary | ICD-10-CM | POA: Insufficient documentation

## 2014-10-13 DIAGNOSIS — R079 Chest pain, unspecified: Secondary | ICD-10-CM | POA: Diagnosis present

## 2014-10-13 DIAGNOSIS — Z794 Long term (current) use of insulin: Secondary | ICD-10-CM | POA: Diagnosis not present

## 2014-10-13 DIAGNOSIS — Z87891 Personal history of nicotine dependence: Secondary | ICD-10-CM | POA: Diagnosis not present

## 2014-10-13 DIAGNOSIS — R072 Precordial pain: Secondary | ICD-10-CM

## 2014-10-13 DIAGNOSIS — R0789 Other chest pain: Secondary | ICD-10-CM | POA: Insufficient documentation

## 2014-10-13 DIAGNOSIS — G4733 Obstructive sleep apnea (adult) (pediatric): Secondary | ICD-10-CM | POA: Insufficient documentation

## 2014-10-13 DIAGNOSIS — F319 Bipolar disorder, unspecified: Secondary | ICD-10-CM | POA: Diagnosis not present

## 2014-10-13 HISTORY — PX: LEFT HEART CATHETERIZATION WITH CORONARY ANGIOGRAM: SHX5451

## 2014-10-13 LAB — GLUCOSE, CAPILLARY
GLUCOSE-CAPILLARY: 92 mg/dL (ref 70–99)
Glucose-Capillary: 103 mg/dL — ABNORMAL HIGH (ref 70–99)

## 2014-10-13 LAB — BASIC METABOLIC PANEL WITH GFR
BUN: 11 mg/dL (ref 6–23)
CALCIUM: 9.2 mg/dL (ref 8.4–10.5)
CO2: 24 mEq/L (ref 19–32)
CREATININE: 0.94 mg/dL (ref 0.50–1.35)
Chloride: 106 mEq/L (ref 96–112)
GFR, Est Non African American: >89 mL/min
GLUCOSE: 111 mg/dL — AB (ref 70–99)
Potassium: 4.4 mEq/L (ref 3.5–5.3)
Sodium: 142 mEq/L (ref 135–145)

## 2014-10-13 LAB — PROTIME-INR
INR: 0.97 (ref <1.50)
Prothrombin Time: 12.9 seconds (ref 11.6–15.2)

## 2014-10-13 SURGERY — LEFT HEART CATHETERIZATION WITH CORONARY ANGIOGRAM
Anesthesia: LOCAL

## 2014-10-13 MED ORDER — SODIUM CHLORIDE 0.9 % IJ SOLN: 3.0000 mL | Freq: Two times a day (BID) | INTRAMUSCULAR | Status: DC

## 2014-10-13 MED ORDER — SODIUM CHLORIDE 0.9 % IV SOLN: INTRAVENOUS | Status: DC

## 2014-10-13 MED ORDER — ASPIRIN 81 MG PO CHEW: 81.0000 mg | CHEWABLE_TABLET | ORAL | Status: DC

## 2014-10-13 MED ORDER — METFORMIN HCL 1000 MG PO TABS: 1000.0000 mg | ORAL_TABLET | Freq: Two times a day (BID) | ORAL | Status: DC

## 2014-10-13 MED ORDER — LIDOCAINE HCL (PF) 1 % IJ SOLN
INTRAMUSCULAR | Status: AC
  Filled 2014-10-13: qty 30

## 2014-10-13 MED ORDER — SODIUM CHLORIDE 0.9 % IV SOLN
1.0000 mL/kg/h | INTRAVENOUS | Status: DC
  Administered 2014-10-13: 1 mL/kg/h via INTRAVENOUS

## 2014-10-13 MED ORDER — HEPARIN SODIUM (PORCINE) 1000 UNIT/ML IJ SOLN
INTRAMUSCULAR | Status: AC
  Filled 2014-10-13: qty 1

## 2014-10-13 MED ORDER — SODIUM CHLORIDE 0.9 % IJ SOLN: 3.0000 mL | INTRAMUSCULAR | Status: DC | PRN

## 2014-10-13 MED ORDER — MIDAZOLAM HCL 2 MG/2ML IJ SOLN
INTRAMUSCULAR | Status: AC
  Filled 2014-10-13: qty 2

## 2014-10-13 MED ORDER — HEPARIN (PORCINE) IN NACL 2-0.9 UNIT/ML-% IJ SOLN
INTRAMUSCULAR | Status: AC
  Filled 2014-10-13: qty 1000

## 2014-10-13 MED ORDER — FENTANYL CITRATE 0.05 MG/ML IJ SOLN
INTRAMUSCULAR | Status: AC
  Filled 2014-10-13: qty 2

## 2014-10-13 MED ORDER — SODIUM CHLORIDE 0.9 % IV SOLN: 250.0000 mL | INTRAVENOUS | Status: DC | PRN

## 2014-10-13 MED ORDER — NITROGLYCERIN 1 MG/10 ML FOR IR/CATH LAB
INTRA_ARTERIAL | Status: AC
  Filled 2014-10-13: qty 10

## 2014-10-13 MED ORDER — VERAPAMIL HCL 2.5 MG/ML IV SOLN
INTRAVENOUS | Status: AC
  Filled 2014-10-13: qty 2

## 2014-10-13 NOTE — H&P (View-Only) (Signed)
HPI: FU coronary artery disease. Patient previously cared for in [facility]. Patient has had a prior inferior posterior myocardial infarction. Cardiac catheterization in 2000 showed an occluded right coronary artery and patient had PCI at that time. Ejection fraction was 50%. Previous monitor showed frequent PVCs. Carotid Dopplers in January 2010 showed less than 50% bilateral stenosis. Nuclear stress test in January of 2015 showed a prior inferior infarct with no ischemia. Ejection fraction 34%. Patient referred to electrophysiology for consideration of ICD. However Dr. Lovena Le did not feel he met criteria for ICD at that point. Echo repeated 12/15 and showed EF 25, moderate LAE and mild MR. Since he was last seen He has some dyspnea with more moderate activities. No orthopnea, PND, pedal edema, syncope. He occasionally has a brief chest pain with vigorous activities for 1-2 seconds.  Current Outpatient Prescriptions  Medication Sig Dispense Refill  . aspirin 325 MG tablet Take 325 mg by mouth daily.    Marland Kitchen buPROPion (WELLBUTRIN XL) 150 MG 24 hr tablet Take 3 tablets by mouth daily.     . carvedilol (COREG) 25 MG tablet Take 1 tablet (25 mg total) by mouth 2 (two) times daily. 180 tablet 0  . clonazePAM (KLONOPIN) 0.5 MG tablet Take 1 tablet by mouth as needed.     . Folic Acid-Vit E0-FHQ R97 (FOLBEE) 2.5-25-1 MG TABS tablet Take 1 tablet by mouth daily. 90 tablet 0  . furosemide (LASIX) 40 MG tablet Take 1 tablet (40 mg total) by mouth as needed. 90 tablet 0  . lamoTRIgine (LAMICTAL) 200 MG tablet Take 1 tablet by mouth daily.    Marland Kitchen losartan (COZAAR) 100 MG tablet Take 1 tablet (100 mg total) by mouth daily. 90 tablet 3  . metFORMIN (GLUCOPHAGE) 1000 MG tablet Take 1,000 mg by mouth 2 (two) times daily.  0  . modafinil (PROVIGIL) 200 MG tablet Take 1 tablet by mouth daily.    . Montelukast Sodium (SINGULAIR PO) Take 10 mg by mouth daily.     . nitroGLYCERIN (NITROSTAT) 0.4 MG SL tablet Place 1  tablet (0.4 mg total) under the tongue every 5 (five) minutes as needed for chest pain. 25 tablet 3  . Oxcarbazepine (TRILEPTAL) 300 MG tablet Take 300 mg by mouth. 1 tablet in the AM and 3 at night  0  . prazosin (MINIPRESS) 5 MG capsule Take 5 mg by mouth 2 (two) times daily.   0  . PROAIR HFA 108 (90 BASE) MCG/ACT inhaler as needed. Asthma  1  . risperiDONE (RISPERDAL) 1 MG tablet Take 1 tablet in the morning and 2 tablets at night    . simvastatin (ZOCOR) 80 MG tablet Take 1 tablet (80 mg total) by mouth daily. 90 tablet 0  . zaleplon (SONATA) 10 MG capsule Take 10 mg by mouth at bedtime as needed.      No current facility-administered medications for this visit.     Past Medical History  Diagnosis Date  . CAD (coronary artery disease)   . OSA (obstructive sleep apnea)   . Obesity   . Bipolar disorder   . Diverticulitis   . Ischemic cardiomyopathy     Past Surgical History  Procedure Laterality Date  . Right elbow surgery      History   Social History  . Marital Status: Married    Spouse Name: N/A    Number of Children: 2  . Years of Education: N/A   Occupational History  .  Social History Main Topics  . Smoking status: Former Research scientist (life sciences)  . Smokeless tobacco: Not on file  . Alcohol Use: Yes     Comment: Rarely  . Drug Use: Not on file  . Sexual Activity: Not on file   Other Topics Concern  . Not on file   Social History Narrative    ROS: no fevers or chills, productive cough, hemoptysis, dysphasia, odynophagia, melena, hematochezia, dysuria, hematuria, rash, seizure activity, orthopnea, PND, pedal edema, claudication. Remaining systems are negative.  Physical Exam: Well-developed well-nourished in no acute distress.  Skin is warm and dry.  HEENT is normal.  Neck is supple.  Chest is clear to auscultation with normal expansion.  Cardiovascular exam is regular rate and rhythm.  Abdominal exam nontender or distended. No masses palpated. Extremities show  no edema. neuro grossly intact  ECG Normal sinus rhythm at a rate of 79. Inferior infarct.

## 2014-10-13 NOTE — CV Procedure (Signed)
       PROCEDURE:  Left heart catheterization with selective coronary angiography, left ventriculogram.  INDICATIONS:  Cardiomyopathy  The risks, benefits, and details of the procedure were explained to the patient.  The patient verbalized und.erstanding and wanted to proceed.  Informed written consent was obtained.  PROCEDURE TECHNIQUE:  After Xylocaine anesthesia a 40F slender sheath was placed in the right radial artery with a single anterior needle wall stick.   IV Heparin was given.  Right coronary angiography was done using a Judkins R4 guide catheter.  Left coronary angiography was done using a Judkins L3.5 guide catheter.  Left ventriculography was done using a pigtail catheter.  A TR band was used for hemostasis.   CONTRAST:  Total of 60 cc.  COMPLICATIONS:  None.    HEMODYNAMICS:  Aortic pressure was 126/87; LV pressure was 125/17; LVEDP 32.  There was no gradient between the left ventricle and aorta.    ANGIOGRAPHIC DATA:   The left main coronary artery is Widely patent.  The left anterior descending artery is  a large vessel which wraps around the apex. There is a large first diagonal. The LAD system is widely patent.  The left circumflex artery is  a medium size vessel. There is a medium-size ramus which is widely patent. There is only mild disease in the circumflex system.  The right coronary artery is  A large dominant vessel.  The stent in the mid vessel is widely patent. There is mild disease in the distal RCA. The posterior lateral artery is widely patent and supplies much of the lateral wall. There is mild diffuse disease. The posterior descending artery is widely patent as well with mild disease.  LEFT VENTRICULOGRAM:  Left ventricular angiogram was done in the 30 RAO projection and revealed severely decreased systolic function.  There is inferior akinesis with hypokinesis of the remainder of the myocardium. The estimated ejection fraction of 25%.  LVEDP was 32  mmHg.  IMPRESSIONS:  1. Normal left main coronary artery. 2. Widely patent left anterior descending artery and its branches. 3. Normal left circumflex artery and its branches. 4. Patent stent in the right coronary artery. 5. Severely decreased left ventricular systolic function with inferior akinesis.  LVEDP 32 mmHg.  Ejection fraction 25%.  RECOMMENDATION:  Cardiomyopathy without significant CAD at this time.  Evidence of prior inferior MI.  Continue medical therapy.  Further plans per Dr. Jens Somrenshaw. COnsider AICD.

## 2014-10-13 NOTE — Discharge Instructions (Signed)
°  Excuse from Work, [employer], or Physical Activity Todd LongsJoseph Jackson(DOB 1970/12/09) needs to be excused from: __X__ Work _____ [employer] _____ Physical activity Beginning now and through the following date: ____December 16, 2015________ _____ He/she may return to work or school but still avoid physical activity from now until: ____________________ _____ He/she may return to full physical activity as of: __December 16, 2015__ Caregiver's signature: ____Varanasi, MD____________________________________  Date: ___December 10, 2015__ Document Released: 04/16/2001 Document Revised: 01/13/2012 Document Reviewed: 10/21/2005 ExitCare Patient Information 2015 [employer]. This information is not intended to replace advice given to you by your health care provider. Make sure you discuss any questions you have with your health care provider.

## 2014-10-13 NOTE — Interval H&P Note (Signed)
Cath Lab Visit (complete for each Cath Lab visit)  Clinical Evaluation Leading to the Procedure:   ACS: No.  Non-ACS:    Anginal Classification: CCS III  Anti-ischemic medical therapy: Minimal Therapy (1 class of medications)  Non-Invasive Test Results: No non-invasive testing performed  Prior CABG: No previous CABG   Ischemic Symptoms? CCS III (Marked limitation of ordinary activity) Anti-ischemic Medical Therapy? Minimal Therapy (1 class of medications) Non-invasive Test Results? Intermediate-risk stress test findings: cardiac mortality 1-3%/year Prior CABG? No Previous CABG   Patient Information:   1-2V CAD, no prox LAD  U (6)  Indication: 16; Score: 6   Patient Information:   CTO of 1 vessel, no other CAD  U (6)  Indication: 26; Score: 6   Patient Information:   1V CAD with prox LAD  A (7)  Indication: 32; Score: 7   Patient Information:   2V-CAD with prox LAD  A (8)  Indication: 38; Score: 8   Patient Information:   3V-CAD without LMCA  A (8)  Indication: 44; Score: 8   Patient Information:   3V-CAD without LMCA With Abnormal LV systolic function  A (9)  Indication: 48; Score: 9   Patient Information:   LMCA-CAD  A (9)  Indication: 49; Score: 9   Patient Information:   2V-CAD with prox LAD PCI  A (7)  Indication: 62; Score: 7   Patient Information:   2V-CAD with prox LAD CABG  A (8)  Indication: 62; Score: 8   Patient Information:   3V-CAD without LMCA With Low CAD burden(i.e., 3 focal stenoses, low SYNTAX score) PCI  A (7)  Indication: 63; Score: 7   Patient Information:   3V-CAD without LMCA With Low CAD burden(i.e., 3 focal stenoses, low SYNTAX score) CABG  A (9)  Indication: 63; Score: 9   Patient Information:   3V-CAD without LMCA E06c - Intermediate-high CAD burden (i.e., multiple diffuse lesions, presence of CTO, or high SYNTAX score) PCI  U (4)  Indication: 64; Score: 4   Patient  Information:   3V-CAD without LMCA E06c - Intermediate-high CAD burden (i.e., multiple diffuse lesions, presence of CTO, or high SYNTAX score) CABG  A (9)  Indication: 64; Score: 9   Patient Information:   LMCA-CAD With Isolated LMCA stenosis  PCI  U (6)  Indication: 65; Score: 6   Patient Information:   LMCA-CAD Additional CAD, low CAD burden (i.e., 1- to 2-vessel additional involvement, low SYNTAX score) PCI  U (5)  Indication: 66; Score: 5   Patient Information:   LMCA-CAD Additional CAD, low CAD burden (i.e., 1- to 2-vessel additional involvement, low SYNTAX score) CABG  A (9)  Indication: 66; Score: 9   Patient Information:   LMCA-CAD With Isolated LMCA stenosis  CABG  A (9)  Indication: 66; Score: 9   Patient Information:   LMCA-CAD Additional CAD, intermediate-high CAD burden (i.e., 3-vessel involvement, presence of CTO, or high SYNTAX score) PCI  I (3)  Indication: 67; Score: 3   Patient Information:   LMCA-CAD Additional CAD, intermediate-high CAD burden (i.e., 3-vessel involvement, presence of CTO, or high SYNTAX score) CABG  A (9)  Indication: 67; Score: 9    History and Physical Interval Note:  10/13/2014 11:05 AM  Todd Jackson  has presented today for surgery, with the diagnosis of cp  The various methods of treatment have been discussed with the patient and family. After consideration of risks, benefits and other options for treatment, the patient has consented  to  Procedure(s): LEFT HEART CATHETERIZATION WITH CORONARY ANGIOGRAM (N/A) as a surgical intervention .  The patient's history has been reviewed, patient examined, no change in status, stable for surgery.  I have reviewed the patient's chart and labs.  Questions were answered to the patient's satisfaction.     Versa Craton S.

## 2014-10-14 ENCOUNTER — Telehealth: Payer: Self-pay | Admitting: Cardiology

## 2014-10-14 ENCOUNTER — Telehealth: Payer: Self-pay | Admitting: *Deleted

## 2014-10-14 ENCOUNTER — Encounter (HOSPITAL_COMMUNITY): Payer: Self-pay | Admitting: Interventional Cardiology

## 2014-10-14 NOTE — Telephone Encounter (Signed)
patient called  Wanted to know results of labs-BMP and last  Echo Results reviewed with patient. RN asked if appointment with Dr Ladona Ridgelaylor has been done Patient states he has an appointment on 10/20/14.

## 2014-10-14 NOTE — Telephone Encounter (Addendum)
10/14/2014 Patient walk in with FMLA form from [employer] [place] to be fill out I had patient fill out release form and collected 25.00 from patient.  I explained to patient what our process was he understood and I told him it will a 1- 2 week process I made copy of form and then put in bag [facility]  cbr  10/20/2014 Received FMLA form back from [place] [facility] [employer] [facility] for Dr. Jens Somrenshaw to sign off on, I took form back and laid it on [employer] Warner[employer]orah desk because she was out of office for her to get Dr. Jens Somrenshaw to sign off on.  cbr  10/21/2014 Received form sign form back from [place] Dr. Jens Somrenshaw nurse called patient to let him know they were ready he told me to fax them, I did fax to form to [employer] [place].  cbr

## 2014-10-25 ENCOUNTER — Other Ambulatory Visit: Payer: Self-pay | Admitting: Cardiology

## 2014-10-25 ENCOUNTER — Telehealth: Payer: Self-pay | Admitting: Cardiology

## 2014-10-25 LAB — BASIC METABOLIC PANEL WITH GFR
BUN: 12 mg/dL (ref 6–23)
CALCIUM: 9.4 mg/dL (ref 8.4–10.5)
CHLORIDE: 105 meq/L (ref 96–112)
CO2: 24 meq/L (ref 19–32)
Creat: 0.98 mg/dL (ref 0.50–1.35)
GFR, Est African American: >89 mL/min
GFR, Est Non African American: >89 mL/min
GLUCOSE: 74 mg/dL (ref 70–99)
Potassium: 4.5 mEq/L (ref 3.5–5.3)
SODIUM: 140 meq/L (ref 135–145)

## 2014-10-25 NOTE — Telephone Encounter (Signed)
New message      Talk to the nurse regarding his echo results and the next step since his ejection fraction dropped

## 2014-10-25 NOTE — Telephone Encounter (Signed)
Spoke with pt, Aware of dr crenshaw's recommendations.  °

## 2014-10-25 NOTE — Telephone Encounter (Signed)
Patient should see Dr Ladona Ridgelaylor for ICD; EF has been decreased for some time; would not use life vest at this point [facility]

## 2014-10-25 NOTE — Telephone Encounter (Signed)
Spoke with pt, he was told by his medical doctor he should be wearing a life vest until he gets an ICD. He was also told by his sister who is a nurse he should have a vest. He wants to know if he needs a life vest. Will forward for dr Jens Somcrenshaw review

## 2014-11-04 DIAGNOSIS — Z95810 Presence of automatic (implantable) cardiac defibrillator: Secondary | ICD-10-CM

## 2014-11-04 HISTORY — DX: Presence of automatic (implantable) cardiac defibrillator: Z95.810

## 2014-11-16 ENCOUNTER — Ambulatory Visit (INDEPENDENT_AMBULATORY_CARE_PROVIDER_SITE_OTHER): Payer: BLUE CROSS/BLUE SHIELD | Admitting: Internal Medicine

## 2014-11-16 ENCOUNTER — Encounter: Payer: Self-pay | Admitting: Internal Medicine

## 2014-11-16 VITALS — BP 122/62 | HR 72 | Ht 73.0 in | Wt 311.4 lb

## 2014-11-16 DIAGNOSIS — E785 Hyperlipidemia, unspecified: Secondary | ICD-10-CM

## 2014-11-16 DIAGNOSIS — E669 Obesity, unspecified: Secondary | ICD-10-CM

## 2014-11-16 DIAGNOSIS — I5022 Chronic systolic (congestive) heart failure: Secondary | ICD-10-CM

## 2014-11-16 DIAGNOSIS — I255 Ischemic cardiomyopathy: Secondary | ICD-10-CM

## 2014-11-16 LAB — BASIC METABOLIC PANEL
BUN: 13 mg/dL (ref 6–23)
CHLORIDE: 106 meq/L (ref 96–112)
CO2: 26 meq/L (ref 19–32)
Calcium: 9.1 mg/dL (ref 8.4–10.5)
Creatinine, Ser: 1 mg/dL (ref 0.40–1.50)
GFR: 85.61 mL/min (ref >60.00)
Glucose, Bld: 91 mg/dL (ref 70–99)
POTASSIUM: 4.5 meq/L (ref 3.5–5.1)
Sodium: 138 mEq/L (ref 135–145)

## 2014-11-16 LAB — CBC WITH DIFFERENTIAL/PLATELET
BASOS ABS: 0 10*3/uL (ref 0.0–0.1)
Basophils Relative: 0.3 % (ref 0.0–3.0)
Eosinophils Absolute: 0.2 10*3/uL (ref 0.0–0.7)
Eosinophils Relative: 2.2 % (ref 0.0–5.0)
HCT: 39.3 % (ref 39.0–52.0)
Hemoglobin: 12.9 g/dL — ABNORMAL LOW (ref 13.0–17.0)
LYMPHS ABS: 2.1 10*3/uL (ref 0.7–4.0)
Lymphocytes Relative: 24.2 % (ref 12.0–46.0)
MCHC: 32.9 g/dL (ref 30.0–36.0)
MCV: 91.4 fl (ref 78.0–100.0)
MONOS PCT: 5.7 % (ref 3.0–12.0)
Monocytes Absolute: 0.5 10*3/uL (ref 0.1–1.0)
NEUTROS ABS: 5.8 10*3/uL (ref 1.4–7.7)
Neutrophils Relative %: 67.6 % (ref 43.0–77.0)
Platelets: 233 10*3/uL (ref 150.0–400.0)
RBC: 4.3 Mil/uL (ref 4.22–5.81)
RDW: 14.6 % (ref 11.5–15.5)
WBC: 8.6 10*3/uL (ref 4.0–10.5)

## 2014-11-16 NOTE — Assessment & Plan Note (Signed)
Continue simvastatin. No muscle aches. He will followup for fasting lipids in the next few months.

## 2014-11-16 NOTE — Patient Instructions (Signed)
Your physician has recommended that you have a defibrillator inserted. An implantable cardioverter defibrillator (ICD) is a small device that is placed in your chest or, in rare cases, your abdomen. This device uses electrical pulses or shocks to help control life-threatening, irregular heartbeats that could lead the heart to suddenly stop beating (sudden cardiac arrest). Leads are attached to the ICD that goes into your heart. This is done in the hospital and usually requires an overnight stay. Please see the instruction sheet given to you today for more information.  Cardioverter Defibrillator Implantation An implantable cardioverter defibrillator (ICD) is a small, lightweight, battery-powered device that is placed (implanted) under the skin in the chest or abdomen. Your caregiver may prescribe an ICD if:  You have had an irregular heart rhythm (arrhythmia) that originated in the lower chambers of the heart (ventricles).  Your heart has been damaged by a disease (such as coronary artery disease) or heart condition (such as a heart attack). An ICD consists of a battery that lasts several years, a small computer called a pulse generator, and wires called leads that go into the heart. It is used to detect and correct two dangerous arrhythmias: a rapid heart rhythm (tachycardia) and an arrhythmia in which the ventricles contract in an uncoordinated way (fibrillation). When an ICD detects tachycardia, it sends an electrical signal to the heart that restores the heartbeat to normal (cardioversion). This signal is usually painless. If cardioversion does not work or if the ICD detects fibrillation, it delivers a small electrical shock to the heart (defibrillation) to restart the heart. The shock may feel like a strong jolt in the chest.ICDs may be programmed to correct other problems. Sometimes, ICDs are programmed to act as another type of implantable device called a pacemaker. Pacemakers are used to treat a  slow heartbeat (bradycardia). LET YOUR CAREGIVER KNOW ABOUT:  Any allergies you have.  All medicines you are taking, including vitamins, herbs, eyedrops, and over-the-counter medicines and creams.  Previous problems you or members of your family have had with the use of anesthetics.  Any blood disorders you have had.  Other health problems you have. RISKS AND COMPLICATIONS Generally, the procedure to implant an ICD is safe. However, as with any surgical procedure, complications can occur. Possible complications associated with implanting an ICD include:  Swelling, bleeding, or bruising at the site where the ICD was implanted.  Infection at the site where the ICD was implanted.  A reaction to medicine used during the procedure.  Nerve, heart, or blood vessel damage.  Blood clots. BEFORE THE PROCEDURE  You may need to have blood tests, heart tests, or a chest X-ray done before the day of the procedure.  Ask your caregiver about changing or stopping your regular medicines.  Make plans to have someone drive you home. You may need to stay in the hospital overnight after the procedure.  Stop smoking at least 24 hours before the procedure.  Take a bath or shower the night before the procedure. You may need to scrub your chest or abdomen with a special type of soap.  Do not eat or drink before your procedure for as long as directed by your caregiver. Ask if it is okay to take any needed medicine with a small sip of water. PROCEDURE  The procedure to implant an ICD in your chest or abdomen is usually done at a hospital in a room that has a large X-ray machine called a fluoroscope. The machine will be above you  during the procedure. It will help your caregiver see your heart during the procedure. Implanting an ICD usually takes 1-3 hours. Before the procedure:   Small monitors will be put on your body. They will be used to check your heart, blood pressure, and oxygen level.  A needle  will be put into a vein in your hand or arm. This is called an intravenous (IV) access tube. Fluids and medicine will flow directly into your body through the IV tube.  Your chest or abdomen will be cleaned with a germ-killing (antiseptic) solution. The area may be shaved.  You may be given medicine to help you relax (sedative).  You will be given a medicine called a local anesthetic. This medicine will make the surgical site numb while the ICD is implanted. You will be sleepy but awake during the procedure. After you are numb the procedure will begin. The caregiver will:  Make a small cut (incision). This will make a pocket deep under your skin that will hold the pulse generator.  Guide the leads through a large blood vessel into your heart and attach them to the heart muscles. Depending on the ICD, the leads may go into one ventricle or they may go to both ventricles and into an upper chamber of the heart (atrium).  Test the ICD.  Close the incision with stitches, glue, or staples. AFTER THE PROCEDURE  You may feel pain. Some pain is normal. It may last a few days.  You may stay in a recovery area until the local anesthetic has worn off. Your blood pressure and pulse will be checked often. You will be taken to a room where your heart will be monitored.  A chest X-ray will be taken. This is done to check that the cardioverter defibrillator is in the right place.  You may stay in the hospital overnight.  A slight bump may be seen over the skin where the ICD was placed. Sometimes, it is possible to feel the ICD under the skin. This is normal.  In the months and years afterward, your caregiver will check the device, the leads, and the battery every few months. Eventually, when the battery is low, the ICD will be replaced. Document Released: 07/13/2002 Document Revised: 08/11/2013 Document Reviewed: 11/09/2012 [facility] Patient Information 2015 [employer], [place]. This information is not  intended to replace advice given to you by your health care provider. Make sure you discuss any questions you have with your health care provider.

## 2014-11-16 NOTE — Assessment & Plan Note (Signed)
His EF has gone from 34% to 25% over the past year and he now has class 2A CHF symptoms. I have discussed the treatment options with the patient and he wishes to proceed with ICD implant. We discussed both SICD as well as DDD ICD and he wishes to proceed with DDD ICD. I would anticipated that he may some day require an LV lead if his CHF worsens and his QRS widens.

## 2014-11-16 NOTE — Progress Notes (Deleted)
.  cve

## 2014-11-16 NOTE — Assessment & Plan Note (Signed)
His symptoms are now class 2A. No indication for a BiV ICD at this time. He will continue his current medical therapy.

## 2014-11-16 NOTE — Progress Notes (Signed)
    HPI Todd Jackson returns today for ICD implantation. He is a pleasant 44 yo man with an ICM, s/p MI in 2000. He has had a reduction in his EF from 34% to 25% over the past year. Despite this, he has not had chest pain. He works full time in a very strenuous job, carrying heavy objects and walking up stairs. He is tired at the end of the day. He has not had syncope. He is able to mow his own lawn with a push mower. He has had no trouble taking losartan after switching from lisinopril which caused a cough. He does note occaisional episodes of sob with exertion.  No Known Allergies   Current Outpatient Prescriptions  Medication Sig Dispense Refill  . aspirin 325 MG tablet Take 325 mg by mouth daily.    . buPROPion (WELLBUTRIN XL) 300 MG 24 hr tablet Take 300 mg by mouth daily.    . carvedilol (COREG) 25 MG tablet Take 1 tablet (25 mg total) by mouth 2 (two) times daily. 180 tablet 0  . clonazePAM (KLONOPIN) 1 MG tablet Take 1 mg by mouth 3 (three) times daily as needed for anxiety.    . Folic Acid-Vit B6-Vit B12 (FOLBEE) 2.5-25-1 MG TABS tablet Take 1 tablet by mouth daily. 90 tablet 0  . furosemide (LASIX) 40 MG tablet Take 1 tablet (40 mg total) by mouth as needed. (Patient taking differently: Take 40 mg by mouth daily as needed for fluid. ) 90 tablet 0  . gabapentin (NEURONTIN) 400 MG capsule Take 400 mg by mouth 3 (three) times daily.  10  . lamoTRIgine (LAMICTAL) 200 MG tablet Take 200 mg by mouth daily.     . losartan (COZAAR) 100 MG tablet Take 1 tablet (100 mg total) by mouth daily. 90 tablet 3  . metFORMIN (GLUCOPHAGE) 1000 MG tablet Take 1 tablet (1,000 mg total) by mouth 2 (two) times daily.  0  . modafinil (PROVIGIL) 200 MG tablet Take 400 mg by mouth daily.     . montelukast (SINGULAIR) 10 MG tablet Take 10 mg by mouth daily.     . nitroGLYCERIN (NITROSTAT) 0.4 MG SL tablet Place 1 tablet (0.4 mg total) under the tongue every 5 (five) minutes as needed for chest pain. 25 tablet  3  . Oxcarbazepine (TRILEPTAL) 300 MG tablet Take 300-900 mg by mouth 2 (two) times daily. 1 tablet in the AM and 3 at night  0  . prazosin (MINIPRESS) 5 MG capsule Take 10 mg by mouth at bedtime.   0  . PROAIR HFA 108 (90 BASE) MCG/ACT inhaler Inhale 1-2 puffs into the lungs every 4 (four) hours as needed for wheezing or shortness of breath. Asthma  1  . risperiDONE (RISPERDAL) 1 MG tablet Take 1-2 mg by mouth 2 (two) times daily. Take 1 tablet in the morning and 2 tablets at night    . simvastatin (ZOCOR) 80 MG tablet Take 1 tablet (80 mg total) by mouth daily. 90 tablet 0  . spironolactone (ALDACTONE) 25 MG tablet Take 1 tablet (25 mg total) by mouth daily. 30 tablet 6  . zaleplon (SONATA) 10 MG capsule Take 10 mg by mouth at bedtime as needed for sleep.      No current facility-administered medications for this visit.     Past Medical History  Diagnosis Date  . CAD (coronary artery disease)   . OSA (obstructive sleep apnea)   . Obesity   . Bipolar disorder   .   Diverticulitis   . Ischemic cardiomyopathy     ROS:   All systems reviewed and negative except as noted in the HPI.   Past Surgical History  Procedure Laterality Date  . Right elbow surgery    . Left heart catheterization with coronary angiogram N/A 10/13/2014    Procedure: LEFT HEART CATHETERIZATION WITH CORONARY ANGIOGRAM;  Surgeon: Jayadeep S Varanasi, MD;  Location: [facility] CATH LAB;  Service: Cardiovascular;  Laterality: N/A;     Family History  Problem Relation Age of Onset  . CAD Mother     MI at age 40  . Alcohol abuse Mother   . Colon cancer Father   . Diabetes Father      History   Social History  . Marital Status: Married    Spouse Name: N/A    Number of Children: 2  . Years of Education: N/A   Occupational History  .     Social History Main Topics  . Smoking status: Former Smoker  . Smokeless tobacco: Not on file  . Alcohol Use: Yes     Comment: Rarely  . Drug Use: Not on file  . Sexual  Activity: Not on file   Other Topics Concern  . Not on file   Social History Narrative     BP 122/62 mmHg  Pulse 72  Ht 6' 1" (1.854 m)  Wt 311 lb 6.4 oz (141.25 kg)  BMI 41.09 kg/m2  Physical Exam:  Obese apearring middle aged man, NAD HEENT: Unremarkable Neck:  7 cm JVD, no thyromegally Back:  No CVA tenderness Lungs:  Clear with no wheezes, rales, or rhonchi HEART:  Regular rate rhythm, no murmurs, no rubs, no clicks Abd:  soft, positive bowel sounds, no organomegally, no rebound, no guarding Ext:  2 plus pulses, no edema, no cyanosis, no clubbing Skin:  No rashes no nodules Neuro:  CN II through XII intact, motor grossly intact  EKG - NSR with ILBBB, QRS 120   Assess/Plan: 

## 2014-11-16 NOTE — Assessment & Plan Note (Signed)
He is overweight. He will need to work on diet and increased activity.

## 2014-12-08 DIAGNOSIS — I255 Ischemic cardiomyopathy: Secondary | ICD-10-CM | POA: Diagnosis not present

## 2014-12-08 DIAGNOSIS — I2510 Atherosclerotic heart disease of native coronary artery without angina pectoris: Secondary | ICD-10-CM | POA: Diagnosis not present

## 2014-12-08 DIAGNOSIS — I509 Heart failure, unspecified: Secondary | ICD-10-CM | POA: Diagnosis not present

## 2014-12-08 DIAGNOSIS — Z7982 Long term (current) use of aspirin: Secondary | ICD-10-CM | POA: Diagnosis not present

## 2014-12-08 DIAGNOSIS — F319 Bipolar disorder, unspecified: Secondary | ICD-10-CM | POA: Diagnosis not present

## 2014-12-08 DIAGNOSIS — I252 Old myocardial infarction: Secondary | ICD-10-CM | POA: Diagnosis not present

## 2014-12-08 DIAGNOSIS — Z95810 Presence of automatic (implantable) cardiac defibrillator: Secondary | ICD-10-CM | POA: Diagnosis not present

## 2014-12-08 DIAGNOSIS — G4733 Obstructive sleep apnea (adult) (pediatric): Secondary | ICD-10-CM | POA: Diagnosis not present

## 2014-12-08 DIAGNOSIS — Z6841 Body Mass Index (BMI) 40.0 and over, adult: Secondary | ICD-10-CM | POA: Diagnosis not present

## 2014-12-08 DIAGNOSIS — E669 Obesity, unspecified: Secondary | ICD-10-CM | POA: Diagnosis not present

## 2014-12-08 DIAGNOSIS — Z87891 Personal history of nicotine dependence: Secondary | ICD-10-CM | POA: Diagnosis not present

## 2014-12-08 MED ORDER — SODIUM CHLORIDE 0.9 % IJ SOLN: 3.0000 mL | INTRAMUSCULAR | Status: DC | PRN

## 2014-12-08 MED ORDER — DEXTROSE 5 % IV SOLN
3.0000 g | INTRAVENOUS | Status: DC
  Filled 2014-12-08 (×2): qty 3000

## 2014-12-08 MED ORDER — SODIUM CHLORIDE 0.9 % IR SOLN
80.0000 mg | Status: DC
  Filled 2014-12-08 (×2): qty 2

## 2014-12-08 MED ORDER — MUPIROCIN 2 % EX OINT
TOPICAL_OINTMENT | Freq: Two times a day (BID) | CUTANEOUS | Status: DC
  Administered 2014-12-09: 07:00:00 via NASAL
  Filled 2014-12-08: qty 22

## 2014-12-08 MED ORDER — SODIUM CHLORIDE 0.9 % IJ SOLN: 3.0000 mL | Freq: Two times a day (BID) | INTRAMUSCULAR | Status: DC

## 2014-12-08 MED ORDER — SODIUM CHLORIDE 0.9 % IV SOLN
INTRAVENOUS | Status: DC
  Administered 2014-12-09: 07:00:00 via INTRAVENOUS

## 2014-12-08 MED ORDER — CHLORHEXIDINE GLUCONATE 4 % EX LIQD
60.0000 mL | Freq: Once | CUTANEOUS | Status: DC
  Filled 2014-12-08: qty 60

## 2014-12-08 MED ORDER — SODIUM CHLORIDE 0.9 % IV SOLN
250.0000 mL | INTRAVENOUS | Status: DC
  Administered 2014-12-09: 250 mL via INTRAVENOUS

## 2014-12-09 ENCOUNTER — Encounter (HOSPITAL_COMMUNITY): Payer: Self-pay | Admitting: Internal Medicine

## 2014-12-09 ENCOUNTER — Ambulatory Visit (HOSPITAL_COMMUNITY)
Admission: RE | Admit: 2014-12-09 | Discharge: 2014-12-10 | Disposition: A | Discharge status: Home / Self Care | Payer: BLUE CROSS/BLUE SHIELD | Source: Ambulatory Visit | Attending: Internal Medicine | Admitting: Internal Medicine

## 2014-12-09 ENCOUNTER — Encounter (HOSPITAL_COMMUNITY)
Admission: RE | Disposition: A | Discharge status: Home / Self Care | Payer: Self-pay | Source: Ambulatory Visit | Attending: Internal Medicine

## 2014-12-09 DIAGNOSIS — I5022 Chronic systolic (congestive) heart failure: Secondary | ICD-10-CM

## 2014-12-09 DIAGNOSIS — Z7982 Long term (current) use of aspirin: Secondary | ICD-10-CM | POA: Insufficient documentation

## 2014-12-09 DIAGNOSIS — I255 Ischemic cardiomyopathy: Secondary | ICD-10-CM | POA: Diagnosis present

## 2014-12-09 DIAGNOSIS — I252 Old myocardial infarction: Secondary | ICD-10-CM | POA: Diagnosis not present

## 2014-12-09 DIAGNOSIS — Z87891 Personal history of nicotine dependence: Secondary | ICD-10-CM | POA: Insufficient documentation

## 2014-12-09 DIAGNOSIS — G4733 Obstructive sleep apnea (adult) (pediatric): Secondary | ICD-10-CM | POA: Insufficient documentation

## 2014-12-09 DIAGNOSIS — I509 Heart failure, unspecified: Secondary | ICD-10-CM | POA: Insufficient documentation

## 2014-12-09 DIAGNOSIS — I2510 Atherosclerotic heart disease of native coronary artery without angina pectoris: Secondary | ICD-10-CM | POA: Insufficient documentation

## 2014-12-09 DIAGNOSIS — E669 Obesity, unspecified: Secondary | ICD-10-CM | POA: Insufficient documentation

## 2014-12-09 DIAGNOSIS — F319 Bipolar disorder, unspecified: Secondary | ICD-10-CM | POA: Insufficient documentation

## 2014-12-09 DIAGNOSIS — Z95810 Presence of automatic (implantable) cardiac defibrillator: Secondary | ICD-10-CM | POA: Diagnosis present

## 2014-12-09 DIAGNOSIS — Z6841 Body Mass Index (BMI) 40.0 and over, adult: Secondary | ICD-10-CM | POA: Insufficient documentation

## 2014-12-09 HISTORY — PX: IMPLANTABLE CARDIOVERTER DEFIBRILLATOR IMPLANT: SHX5473

## 2014-12-09 LAB — BASIC METABOLIC PANEL
Anion gap: 9 (ref 5–15)
BUN: 15 mg/dL (ref 6–23)
CO2: 25 mmol/L (ref 19–32)
CREATININE: 1.02 mg/dL (ref 0.50–1.35)
Calcium: 8.9 mg/dL (ref 8.4–10.5)
Chloride: 106 mmol/L (ref 96–112)
GFR calc non Af Amer: 87 mL/min — ABNORMAL LOW (ref >90)
GLUCOSE: 97 mg/dL (ref 70–99)
Potassium: 3.9 mmol/L (ref 3.5–5.1)
Sodium: 140 mmol/L (ref 135–145)

## 2014-12-09 LAB — SURGICAL PCR SCREEN
MRSA, PCR: NEGATIVE
Staphylococcus aureus: POSITIVE — AB

## 2014-12-09 LAB — CBC
HCT: 38.6 % — ABNORMAL LOW (ref 39.0–52.0)
HEMOGLOBIN: 13 g/dL (ref 13.0–17.0)
MCH: 30.1 pg (ref 26.0–34.0)
MCHC: 33.7 g/dL (ref 30.0–36.0)
MCV: 89.4 fL (ref 78.0–100.0)
PLATELETS: 190 10*3/uL (ref 150–400)
RBC: 4.32 MIL/uL (ref 4.22–5.81)
RDW: 13.8 % (ref 11.5–15.5)
WBC: 7.2 10*3/uL (ref 4.0–10.5)

## 2014-12-09 LAB — GLUCOSE, CAPILLARY
Glucose-Capillary: 95 mg/dL (ref 70–99)
Glucose-Capillary: 90 mg/dL (ref 70–99)
Glucose-Capillary: 74 mg/dL (ref 70–99)
Glucose-Capillary: 107 mg/dL — ABNORMAL HIGH (ref 70–99)

## 2014-12-09 SURGERY — IMPLANTABLE CARDIOVERTER DEFIBRILLATOR IMPLANT
Anesthesia: LOCAL

## 2014-12-09 MED ORDER — LIDOCAINE HCL (PF) 1 % IJ SOLN
INTRAMUSCULAR | Status: AC
  Filled 2014-12-09: qty 60

## 2014-12-09 MED ORDER — RISPERIDONE 1 MG PO TABS
1.0000 mg | ORAL_TABLET | Freq: Every day | ORAL | Status: DC
  Administered 2014-12-10: 1 mg via ORAL
  Filled 2014-12-09: qty 1

## 2014-12-09 MED ORDER — SPIRONOLACTONE 25 MG PO TABS
25.0000 mg | ORAL_TABLET | Freq: Every day | ORAL | Status: DC
  Administered 2014-12-09 – 2014-12-10 (×2): 25 mg via ORAL
  Filled 2014-12-09 (×2): qty 1

## 2014-12-09 MED ORDER — MIDAZOLAM HCL 5 MG/5ML IJ SOLN
INTRAMUSCULAR | Status: AC
  Filled 2014-12-09: qty 5

## 2014-12-09 MED ORDER — MONTELUKAST SODIUM 10 MG PO TABS
10.0000 mg | ORAL_TABLET | Freq: Every day | ORAL | Status: DC
  Administered 2014-12-09 – 2014-12-10 (×2): 10 mg via ORAL
  Filled 2014-12-09 (×2): qty 1

## 2014-12-09 MED ORDER — PRAZOSIN HCL 5 MG PO CAPS
5.0000 mg | ORAL_CAPSULE | Freq: Two times a day (BID) | ORAL | Status: DC
  Administered 2014-12-10: 5 mg via ORAL
  Filled 2014-12-09 (×3): qty 1

## 2014-12-09 MED ORDER — HEPARIN (PORCINE) IN NACL 2-0.9 UNIT/ML-% IJ SOLN
INTRAMUSCULAR | Status: AC
  Filled 2014-12-09: qty 500

## 2014-12-09 MED ORDER — ONDANSETRON HCL 4 MG/2ML IJ SOLN: 4.0000 mg | Freq: Four times a day (QID) | INTRAMUSCULAR | Status: DC | PRN

## 2014-12-09 MED ORDER — CARVEDILOL 25 MG PO TABS
25.0000 mg | ORAL_TABLET | Freq: Two times a day (BID) | ORAL | Status: DC
  Administered 2014-12-09 – 2014-12-10 (×2): 25 mg via ORAL
  Filled 2014-12-09 (×2): qty 1

## 2014-12-09 MED ORDER — CEFAZOLIN SODIUM 1-5 GM-% IV SOLN
1.0000 g | Freq: Four times a day (QID) | INTRAVENOUS | Status: AC
  Administered 2014-12-09 – 2014-12-10 (×3): 1 g via INTRAVENOUS
  Filled 2014-12-09 (×3): qty 50

## 2014-12-09 MED ORDER — CLONAZEPAM 1 MG PO TABS: 1.0000 mg | ORAL_TABLET | Freq: Three times a day (TID) | ORAL | Status: DC | PRN

## 2014-12-09 MED ORDER — RISPERIDONE 2 MG PO TABS
2.0000 mg | ORAL_TABLET | Freq: Every day | ORAL | Status: DC
  Administered 2014-12-09: 2 mg via ORAL
  Filled 2014-12-09 (×2): qty 1

## 2014-12-09 MED ORDER — METFORMIN HCL 500 MG PO TABS: 1000.0000 mg | ORAL_TABLET | Freq: Two times a day (BID) | ORAL | Status: DC

## 2014-12-09 MED ORDER — FENTANYL CITRATE 0.05 MG/ML IJ SOLN
INTRAMUSCULAR | Status: AC
  Filled 2014-12-09: qty 2

## 2014-12-09 MED ORDER — GABAPENTIN 400 MG PO CAPS
400.0000 mg | ORAL_CAPSULE | Freq: Three times a day (TID) | ORAL | Status: DC
  Administered 2014-12-09 – 2014-12-10 (×3): 400 mg via ORAL
  Filled 2014-12-09 (×3): qty 1

## 2014-12-09 MED ORDER — LOSARTAN POTASSIUM 50 MG PO TABS
100.0000 mg | ORAL_TABLET | Freq: Every day | ORAL | Status: DC
  Administered 2014-12-09 – 2014-12-10 (×2): 100 mg via ORAL
  Filled 2014-12-09 (×2): qty 2

## 2014-12-09 MED ORDER — FA-PYRIDOXINE-CYANOCOBALAMIN 2.5-25-2 MG PO TABS
1.0000 | ORAL_TABLET | Freq: Every day | ORAL | Status: DC
  Administered 2014-12-10: 1 via ORAL
  Filled 2014-12-09: qty 1

## 2014-12-09 MED ORDER — ALBUTEROL SULFATE (2.5 MG/3ML) 0.083% IN NEBU: 3.0000 mL | INHALATION_SOLUTION | RESPIRATORY_TRACT | Status: DC | PRN

## 2014-12-09 MED ORDER — BUPROPION HCL ER (XL) 150 MG PO TB24
450.0000 mg | ORAL_TABLET | Freq: Every day | ORAL | Status: DC
  Administered 2014-12-09 – 2014-12-10 (×2): 450 mg via ORAL
  Filled 2014-12-09 (×2): qty 3

## 2014-12-09 MED ORDER — NITROGLYCERIN 0.4 MG SL SUBL: 0.4000 mg | SUBLINGUAL_TABLET | SUBLINGUAL | Status: DC | PRN

## 2014-12-09 MED ORDER — MODAFINIL 200 MG PO TABS
200.0000 mg | ORAL_TABLET | Freq: Every day | ORAL | Status: DC
  Filled 2014-12-09: qty 1

## 2014-12-09 MED ORDER — ASPIRIN 325 MG PO TABS
325.0000 mg | ORAL_TABLET | Freq: Every day | ORAL | Status: DC
  Administered 2014-12-10: 325 mg via ORAL
  Filled 2014-12-09: qty 1

## 2014-12-09 MED ORDER — OXCARBAZEPINE 300 MG PO TABS
300.0000 mg | ORAL_TABLET | Freq: Every day | ORAL | Status: DC
  Administered 2014-12-10: 300 mg via ORAL
  Filled 2014-12-09: qty 1

## 2014-12-09 MED ORDER — OXCARBAZEPINE 300 MG PO TABS
900.0000 mg | ORAL_TABLET | Freq: Every day | ORAL | Status: DC
  Administered 2014-12-09: 900 mg via ORAL
  Filled 2014-12-09: qty 3

## 2014-12-09 MED ORDER — FOLIC ACID-VIT B6-VIT B12 2.5-25-1 MG PO TABS: 1.0000 | ORAL_TABLET | Freq: Every day | ORAL | Status: DC

## 2014-12-09 MED ORDER — ACETAMINOPHEN 325 MG PO TABS
325.0000 mg | ORAL_TABLET | ORAL | Status: DC | PRN
  Administered 2014-12-09 – 2014-12-10 (×2): 650 mg via ORAL
  Filled 2014-12-09 (×2): qty 2

## 2014-12-09 MED ORDER — MUPIROCIN 2 % EX OINT
TOPICAL_OINTMENT | CUTANEOUS | Status: AC
  Filled 2014-12-09: qty 22

## 2014-12-09 MED ORDER — ZOLPIDEM TARTRATE 5 MG PO TABS: 5.0000 mg | ORAL_TABLET | Freq: Every evening | ORAL | Status: DC | PRN

## 2014-12-09 MED ORDER — LAMOTRIGINE 100 MG PO TABS
200.0000 mg | ORAL_TABLET | Freq: Every day | ORAL | Status: DC
Start: 2014-12-09 — End: 2014-12-10
  Administered 2014-12-09: 200 mg via ORAL
  Filled 2014-12-09: qty 2

## 2014-12-09 NOTE — H&P (Signed)
  ICD Criteria  Current LVEF:25% ;Obtained > or = 1 month ago and < or = 3 months ago.  NYHA Functional Classification: Class II  Heart Failure History:  Yes, Duration of heart failure since onset is > 9 months  Non-Ischemic Dilated Cardiomyopathy History:  No.  Atrial Fibrillation/Atrial Flutter:  No.  Ventricular Tachycardia History:  No.  Cardiac Arrest History:  No  History of Syndromes with Risk of Sudden Death:  No.  Previous ICD:  No.  Electrophysiology Study: No.  Prior MI: Yes, Most recent MI timeframe is > 40 days.  PPM: No.  OSA:  Yes  Patient Life Expectancy of >=1 year: Yes.  Anticoagulation Therapy:  Patient is NOT on anticoagulation therapy.   Beta Blocker Therapy:  Yes.   Ace Inhibitor/ARB Therapy:  Yes.

## 2014-12-09 NOTE — H&P (View-Only) (Signed)
HPI Mr. [name] returns today for ICD implantation. He is a pleasant 44 yo man with an ICM, s/p MI in 2000. He has had a reduction in his EF from 34% to 25% over the past year. Despite this, he has not had chest pain. He works full time in a very strenuous job, carrying heavy objects and walking up stairs. He is tired at the end of the day. He has not had syncope. He is able to mow his own lawn with a push mower. He has had no trouble taking losartan after switching from lisinopril which caused a cough. He does note occaisional episodes of sob with exertion.  No Known Allergies   Current Outpatient Prescriptions  Medication Sig Dispense Refill  . aspirin 325 MG tablet Take 325 mg by mouth daily.    Marland Kitchen. buPROPion (WELLBUTRIN XL) 300 MG 24 hr tablet Take 300 mg by mouth daily.    . carvedilol (COREG) 25 MG tablet Take 1 tablet (25 mg total) by mouth 2 (two) times daily. 180 tablet 0  . clonazePAM (KLONOPIN) 1 MG tablet Take 1 mg by mouth 3 (three) times daily as needed for anxiety.    . Folic Acid-Vit B6-Vit B12 (FOLBEE) 2.5-25-1 MG TABS tablet Take 1 tablet by mouth daily. 90 tablet 0  . furosemide (LASIX) 40 MG tablet Take 1 tablet (40 mg total) by mouth as needed. (Patient taking differently: Take 40 mg by mouth daily as needed for fluid. ) 90 tablet 0  . gabapentin (NEURONTIN) 400 MG capsule Take 400 mg by mouth 3 (three) times daily.  10  . lamoTRIgine (LAMICTAL) 200 MG tablet Take 200 mg by mouth daily.     Marland Kitchen. losartan (COZAAR) 100 MG tablet Take 1 tablet (100 mg total) by mouth daily. 90 tablet 3  . metFORMIN (GLUCOPHAGE) 1000 MG tablet Take 1 tablet (1,000 mg total) by mouth 2 (two) times daily.  0  . modafinil (PROVIGIL) 200 MG tablet Take 400 mg by mouth daily.     . montelukast (SINGULAIR) 10 MG tablet Take 10 mg by mouth daily.     . nitroGLYCERIN (NITROSTAT) 0.4 MG SL tablet Place 1 tablet (0.4 mg total) under the tongue every 5 (five) minutes as needed for chest pain. 25 tablet  3  . Oxcarbazepine (TRILEPTAL) 300 MG tablet Take 300-900 mg by mouth 2 (two) times daily. 1 tablet in the AM and 3 at night  0  . prazosin (MINIPRESS) 5 MG capsule Take 10 mg by mouth at bedtime.   0  . PROAIR HFA 108 (90 BASE) MCG/ACT inhaler Inhale 1-2 puffs into the lungs every 4 (four) hours as needed for wheezing or shortness of breath. Asthma  1  . risperiDONE (RISPERDAL) 1 MG tablet Take 1-2 mg by mouth 2 (two) times daily. Take 1 tablet in the morning and 2 tablets at night    . simvastatin (ZOCOR) 80 MG tablet Take 1 tablet (80 mg total) by mouth daily. 90 tablet 0  . spironolactone (ALDACTONE) 25 MG tablet Take 1 tablet (25 mg total) by mouth daily. 30 tablet 6  . zaleplon (SONATA) 10 MG capsule Take 10 mg by mouth at bedtime as needed for sleep.      No current facility-administered medications for this visit.     Past Medical History  Diagnosis Date  . CAD (coronary artery disease)   . OSA (obstructive sleep apnea)   . Obesity   . Bipolar disorder   .  Diverticulitis   . Ischemic cardiomyopathy     ROS:   All systems reviewed and negative except as noted in the HPI.   Past Surgical History  Procedure Laterality Date  . Right elbow surgery    . Left heart catheterization with coronary angiogram N/A 10/13/2014    Procedure: LEFT HEART CATHETERIZATION WITH CORONARY ANGIOGRAM;  Surgeon: Corky Crafts, MD;  Location: [facility] CATH LAB;  Service: Cardiovascular;  Laterality: N/A;     Family History  Problem Relation Age of Onset  . CAD Mother     MI at age 45  . Alcohol abuse Mother   . Colon cancer Father   . Diabetes Father      History   Social History  . Marital Status: Married    Spouse Name: N/A    Number of Children: 2  . Years of Education: N/A   Occupational History  .     Social History Main Topics  . Smoking status: Former Games developer  . Smokeless tobacco: Not on file  . Alcohol Use: Yes     Comment: Rarely  . Drug Use: Not on file  . Sexual  Activity: Not on file   Other Topics Concern  . Not on file   Social History Narrative     BP 122/62 mmHg  Pulse 72  Ht  (1.854 m)  Wt 311 lb 6.4 oz (141.25 kg)  BMI 41.09 kg/m2  Physical Exam:  Obese apearring middle aged man, NAD HEENT: Unremarkable Neck:  7 cm JVD, no thyromegally Back:  No CVA tenderness Lungs:  Clear with no wheezes, rales, or rhonchi HEART:  Regular rate rhythm, no murmurs, no rubs, no clicks Abd:  soft, positive bowel sounds, no organomegally, no rebound, no guarding Ext:  2 plus pulses, no edema, no cyanosis, no clubbing Skin:  No rashes no nodules Neuro:  CN II through XII intact, motor grossly intact  EKG - NSR with ILBBB, QRS 120   Assess/Plan:

## 2014-12-09 NOTE — CV Procedure (Signed)
   SURGEON:  Lewayne BuntingGregg Coleen Cardiff, MD      PREPROCEDURE DIAGNOSES:   1. Ischemic cardiomyopathy.   2. New York Heart Association class III, heart failure chronically.      POSTPROCEDURE DIAGNOSES:   1. Ischemic cardiomyopathy.   2. New York Heart Association class III heart failure chronically.      PROCEDURES:    1. ICD implantation.  2.  Defibrillation threshold testing     INTRODUCTION:  Todd Jackson is a 44 y.o. male with an ischemic CM (EF 25), NYHA Class III CHF, and CAD. At this time, he meets MADIT II/ SCD-HeFT criteria for ICD implantation for primary prevention of sudden death.  The patient has a narrow QRS and does not meet criteria for revascularization.  The patient has been treated with an optimal medical regimen but continues to have a depressed ejection fraction and NYHA Class III CHF symptoms.  The patient therefore  presents today for ICD implantation.      DESCRIPTION OF PROCEDURE:  Informed written consent was obtained and the patient was brought to the electrophysiology lab in the fasting state. The patient was adequately sedated with intravenous Versed, and fentanyl as outlined in the nursing report.  The patient's left chest was prepped and draped in the usual sterile fashion by the EP lab staff.  The skin overlying the left deltopectoral region was infiltrated with lidocaine for local analgesia.  A 5-cm incision was made over the left deltopectoral region.  A left subcutaneous defibrillator pocket was fashioned using a combination of sharp and blunt dissection.  Electrocautery was used to assure hemostasis.     RV Lead Placement: The left axillary vein was cannulated with fluoroscopic visualization.  No contrast was required for this endeavor.  Through the left axillary vein, a [employer]  (serial # [id number]  ) right ventricular defibrillator lead was advanced with fluoroscopic visualization into the right ventricular apical septal position. The right ventricular lead  R-wave measured 11 mV with impedance of 870 ohms and a threshold of 0.4 volts at 0.5 milliseconds.   The lead was secured to the pectoralis  fascia using #2 silk suture over the suture sleeve.  The pocket then  irrigated with copious gentamicin solution.  The lead was then  connected to a [employer] (serial  Number [id number]) ICD.  The defibrillator was placed into the  pocket.  The pocket was then closed in 2 layers with 2.0 Vicryl suture  for the subcutaneous and subcuticular layers.  Steri-Strips and a  sterile dressing were then applied.   DFT Testing: Defibrillation Threshold testing was then performed. Ventricular fibrillation was induced with a T shock.  Adequate sensing of ventricular fibrillation was observed with minimal dropout with a programmed sensitivity of 1.382mV.  The patient was successfully defibrillated to sinus rhythm with a single 20 joules shock delivered from the device with an impedance of 70 ohms in a duration of 9 seconds.  The patient remained in sinus rhythm thereafter.  There were no early apparent complications.     CONCLUSIONS:   1. Ischemic cardiomyopathy with chronic New York Heart Association class III heart failure.   2. Successful ICD implantation.   3. DFT less than or equal to 20 joules.   4. No early apparent complications.   Lewayne BuntingGregg Aseel Uhde, MD  8:45 AM 12/09/2014

## 2014-12-09 NOTE — Discharge Summary (Signed)
Marland Kitchen. Electrophysiology Discharge Summary  Patient ID: Todd Jackson MRN: [id number] DOB/AGE: 46-22-1972 45 y.o.   Primary Cardiologist: Dr. Jens Somrenshaw Electrophysiologist: Dr. Ladona Ridgelaylor  Admit date: 12/09/2014 Discharge date: 12/10/2014  Admission Diagnoses: Ischemic Cardiomyopathy with EF < 35%  Discharge Diagnoses:  Active Problems:   Ischemic cardiomyopathy   S/P ICD (internal cardiac defibrillator) procedure   Discharged Condition: stable  Hospital Course: [name] is a 44 y/o male, followed by Dr. Jens Somrenshaw, with a history of ICM, s/p MI in 2000. This was subsequent to an occluded RCA, successfully treated with PCI. He has had a reduction in his EF from 34% to 25% over the past year. Subsequently, he was referred to Dr. Ladona Ridgelaylor who recommended ICD implantation for primary prevention for SCD.  The patient [facility] to undergo the planned procedure. The procedure was performed by Dr. Ladona Ridgelaylor. He underwent successful insertion of a medtronic ICD. He tolerated the procedure well. There were no immediate complications. He was monitored overnight and remained stable. He denied CP and dyspnea. Post-operative CXR demonstrated proper lead placement and no evidence for pneumothorax. Device interrogation revealed normal functioning. His incision site remained stable w/o complication. Vital signs also remained stable.  He was continued on heart failure therapies including Coreg, losartan and spironolactone. He was last seen and examined by Dr. Ladona Ridgelaylor who determined he was stable for discharge home. 7-10 day wound check follow-up has been arranged for 12/19/14.    Consults: None  Significant Diagnostic Studies:   Post-operative CXR 12/10/14 neg for PTX   Treatments:   ICD Implantation 12/09/14 RV Lead Placement: The left axillary vein was cannulated with fluoroscopic visualization. No contrast was required for this endeavor. Through the left axillary vein, a [employer]  (serial # [id number] ) right ventricular defibrillator lead was advanced with fluoroscopic visualization into the right ventricular apical septal position. The right ventricular lead R-wave measured 11 mV with impedance of 870 ohms and a threshold of 0.4 volts at 0.5 milliseconds.   The lead was secured to the pectoralis fascia using #2 silk suture over the suture sleeve. The pocket then irrigated with copious gentamicin solution. The lead was then connected to a Medtronic (serial Number [id number]) ICD. The defibrillator was placed into the pocket. The pocket was then closed in 2 layers with 2.0 Vicryl suture for the subcutaneous and subcuticular layers. Steri-Strips and a sterile dressing were then applied.   DFT Testing: Defibrillation Threshold testing was then performed. Ventricular fibrillation was induced with a T shock. Adequate sensing of ventricular fibrillation was observed with minimal dropout with a programmed sensitivity of 1.432mV. The patient was successfully defibrillated to sinus rhythm with a single 20 joules shock delivered from the device with an impedance of 70 ohms in a duration of 9 seconds. The patient remained in sinus rhythm thereafter. There were no early apparent complications.     Discharge Exam: Blood pressure 124/72, pulse 70, temperature 98.1 F (36.7 C), temperature source Oral, resp. rate 15, height 6\' 1"  (1.854 m), weight 302 lb (136.986 kg), SpO2 96 %.  Well appearing obese man, NAD HEENT: Unremarkable,[place], AT Neck:  6 JVD, no thyromegally Back:  No CVA tenderness Lungs:  Clear with no wheezes, rales, or rhonchi, well healed PPM incision. HEART:  Regular rate rhythm, no murmurs, no rubs, no clicks Abd:  soft, positive bowel sounds, no organomegally, no rebound, no guarding Ext:  2 plus pulses, no edema, no cyanosis, no clubbing Skin:  No rashes no nodules Neuro:  CN II through XII intact, motor grossly intact  Disposition: 01-Home or Self  Care     Medication List    TAKE these medications        aspirin 325 MG tablet  Take 325 mg by mouth daily.     buPROPion 300 MG 24 hr tablet  Commonly known as:  WELLBUTRIN XL  Take 450 mg by mouth daily.     carvedilol 25 MG tablet  Commonly known as:  COREG  Take 1 tablet (25 mg total) by mouth 2 (two) times daily.     clonazePAM 1 MG tablet  Commonly known as:  KLONOPIN  Take 1 mg by mouth 3 (three) times daily as needed for anxiety.     Folic Acid-Vit B6-Vit B12 2.5-25-1 MG Tabs tablet  Commonly known as:  FOLBEE  Take 1 tablet by mouth daily.     furosemide 40 MG tablet  Commonly known as:  LASIX  Take 1 tablet (40 mg total) by mouth as needed.     gabapentin 400 MG capsule  Commonly known as:  NEURONTIN  Take 400 mg by mouth 3 (three) times daily.     lamoTRIgine 200 MG tablet  Commonly known as:  LAMICTAL  Take 200 mg by mouth daily.     losartan 100 MG tablet  Commonly known as:  COZAAR  Take 1 tablet (100 mg total) by mouth daily.     metFORMIN 1000 MG tablet  Commonly known as:  GLUCOPHAGE  Take 1 tablet (1,000 mg total) by mouth 2 (two) times daily.     modafinil 200 MG tablet  Commonly known as:  PROVIGIL  Take 200 mg by mouth daily.     montelukast 10 MG tablet  Commonly known as:  SINGULAIR  Take 10 mg by mouth daily.     nitroGLYCERIN 0.4 MG SL tablet  Commonly known as:  NITROSTAT  Place 1 tablet (0.4 mg total) under the tongue every 5 (five) minutes as needed for chest pain.     Oxcarbazepine 300 MG tablet  Commonly known as:  TRILEPTAL  Take 300-900 mg by mouth 2 (two) times daily. 1 tablet in the AM and 3 at night     prazosin 5 MG capsule  Commonly known as:  MINIPRESS  Take 5 mg by mouth 2 (two) times daily.     PROAIR HFA 108 (90 BASE) MCG/ACT inhaler  Generic drug:  albuterol  Inhale 1-2 puffs into the lungs every 4 (four) hours as needed for wheezing or shortness of breath. Asthma     risperiDONE 1 MG tablet  Commonly  known as:  RISPERDAL  Take 1-2 mg by mouth 2 (two) times daily. Take 1 tablet in the morning and 2 tablets at night     simvastatin 80 MG tablet  Commonly known as:  ZOCOR  Take 1 tablet (80 mg total) by mouth daily.     spironolactone 25 MG tablet  Commonly known as:  ALDACTONE  Take 1 tablet (25 mg total) by mouth daily.     zaleplon 10 MG capsule  Commonly known as:  SONATA  Take 10 mg by mouth at bedtime as needed for sleep.       Follow-up Information    Follow up with [facility] On 12/19/2014.   Why:  For wound re-check, For suture removal 2:00 pm    Contact information:   [address] [place] [address]  Follow up with Olga Millers, MD On 12/14/2014.   Specialty:  Cardiology   Why:  @ 9:30am   Contact information:   [address] [place] [address] [phone]       TIME SPENT ON DISCHARGE INCLUDING PHYSICIAN TIME: > 30 MINUTES  Original note by Hardin Negus PA-C   Signed: Janetta Hora 12/10/2014, 9:34 AM  EP Attending  Patient seen and examined. Agree with above with modifications. He is ready for discharge, usual followup.  Leonia Reeves.D.

## 2014-12-09 NOTE — Interval H&P Note (Signed)
History and Physical Interval Note:  12/09/2014 7:23 AM  Todd Jackson  has presented today for surgery, with the diagnosis of ischemic cardiomyopathy  The various methods of treatment have been discussed with the patient and family. After consideration of risks, benefits and other options for treatment, the patient has consented to  Procedure(s): IMPLANTABLE CARDIOVERTER DEFIBRILLATOR IMPLANT (N/A) as a surgical intervention .  The patient's history has been reviewed, patient examined, no change in status, stable for surgery.  I have reviewed the patient's chart and labs.  Questions were answered to the patient's satisfaction.     Leonia ReevesGregg Taylor,M.D.

## 2014-12-09 NOTE — Progress Notes (Signed)
UR Completed Ten[place] Mcclure Graves-Bigelow, RN,BSN [phone]  

## 2014-12-10 ENCOUNTER — Ambulatory Visit (HOSPITAL_COMMUNITY): Payer: BLUE CROSS/BLUE SHIELD

## 2014-12-10 DIAGNOSIS — I255 Ischemic cardiomyopathy: Secondary | ICD-10-CM | POA: Diagnosis not present

## 2014-12-10 LAB — GLUCOSE, CAPILLARY
Glucose-Capillary: 95 mg/dL (ref 70–99)
Glucose-Capillary: 87 mg/dL (ref 70–99)

## 2014-12-10 MED ORDER — MODAFINIL 100 MG PO TABS: 200.0000 mg | ORAL_TABLET | Freq: Every day | ORAL | Status: DC

## 2014-12-10 NOTE — Progress Notes (Signed)
CPAP set up and patient placed on via F[facility], auto titrate settings.  Patient tolerating well at this time.  Vital signs stable.

## 2014-12-10 NOTE — Discharge Instructions (Addendum)
Supplemental Discharge Instructions for  Pacemaker/Defibrillator Patients  Activity No heavy lifting or vigorous activity with your left/right arm for 6 to 8 weeks.  Do not raise your left/right arm above your head for one week.  Gradually raise your affected arm as drawn below.             2/7                            2/8                          2/9                         2/10 __  NO DRIVING for  1 week   ; you may begin driving on   1/61/09  .  WOUND CARE - Keep the wound area clean and dry.  Do not get this area wet for one week. No showers for one week; you may shower on     . - The tape/steri-strips on your wound will fall off; do not pull them off.  No bandage is needed on the site.  DO  NOT apply any creams, oils, or ointments to the wound area. - If you notice any drainage or discharge from the wound, any swelling or bruising at the site, or you develop a fever > 101? F after you are discharged home, call the office at once.  Special Instructions - You are still able to use cellular telephones; use the ear opposite the side where you have your pacemaker/defibrillator.  Avoid carrying your cellular phone near your device. - When traveling through airports, show security personnel your identification card to avoid being screened in the metal detectors.  Ask the security personnel to use the hand wand. - Avoid arc welding equipment, MRI testing (magnetic resonance imaging), TENS units (transcutaneous nerve stimulators).  Call the office for questions about other devices. - Avoid electrical appliances that are in poor condition or are not properly grounded. - Microwave ovens are safe to be near or to operate.  Additional information for defibrillator patients should your device go off: - If your device goes off ONCE and you feel fine afterward, notify the device clinic nurses. - If your device goes off ONCE and you do not feel well afterward, call 911. - If your device goes  off TWICE, call 911. - If your device goes off THREE times in one day, call 911.  DO NOT DRIVE YOURSELF OR A FAMILY MEMBER WITH A DEFIBRILLATOR TO THE HOSPITAL--CALL 911.   Low-Sodium Eating Plan Sodium raises blood pressure and causes water to be held in the body. Getting less sodium from food will help lower your blood pressure, reduce any swelling, and protect your heart, liver, and kidneys. We get sodium by adding salt (sodium chloride) to food. Most of our sodium comes from canned, boxed, and frozen foods. Restaurant foods, fast foods, and pizza are also very high in sodium. Even if you take medicine to lower your blood pressure or to reduce fluid in your body, getting less sodium from your food is important. WHAT IS MY PLAN? Most people should limit their sodium intake to 2,300 mg a day. Your health care provider recommends that you limit your sodium intake to __________ a day.  WHAT DO I NEED TO KNOW ABOUT THIS EATING PLAN?  For the low-sodium eating plan, you will follow these general guidelines:  Choose foods with a % Daily Value for sodium of less than 5% (as listed on the food label).   Use salt-free seasonings or herbs instead of table salt or sea salt.   Check with your health care provider or pharmacist before using salt substitutes.   Eat fresh foods.  Eat more vegetables and fruits.  Limit canned vegetables. If you do use them, rinse them well to decrease the sodium.   Limit cheese to 1 oz (28 g) per day.   Eat lower-sodium products, often labeled as "lower sodium" or "no salt added."  Avoid foods that contain monosodium glutamate (MSG). MSG is sometimes added to [place] food and some canned foods.  Check food labels (Nutrition Facts labels) on foods to learn how much sodium is in one serving.  Eat more home-cooked food and less restaurant, buffet, and fast food.  When eating at a restaurant, ask that your food be prepared with less salt or none, if  possible.  HOW DO I READ FOOD LABELS FOR SODIUM INFORMATION? The Nutrition Facts label lists the amount of sodium in one serving of the food. If you eat more than one serving, you must multiply the listed amount of sodium by the number of servings. Food labels may also identify foods as:  Sodium free--Less than 5 mg in a serving.  Very low sodium--35 mg or less in a serving.  Low sodium--140 mg or less in a serving.  Light in sodium--50% less sodium in a serving. For example, if a food that usually has 300 mg of sodium is changed to become light in sodium, it will have 150 mg of sodium.  Reduced sodium--25% less sodium in a serving. For example, if a food that usually has 400 mg of sodium is changed to reduced sodium, it will have 300 mg of sodium. WHAT FOODS CAN I EAT? Grains Low-sodium cereals, including oats, puffed wheat and rice, and shredded wheat cereals. Low-sodium crackers. Unsalted rice and pasta. Lower-sodium bread.  Vegetables Frozen or fresh vegetables. Low-sodium or reduced-sodium canned vegetables. Low-sodium or reduced-sodium tomato sauce and paste. Low-sodium or reduced-sodium tomato and vegetable juices.  Fruits Fresh, frozen, and canned fruit. Fruit juice.  Meat and Other Protein Products Low-sodium canned tuna and salmon. Fresh or frozen meat, poultry, seafood, and fish. Lamb. Unsalted nuts. Dried beans, peas, and lentils without added salt. Unsalted canned beans. Homemade soups without salt. Eggs.  Dairy Milk. Soy milk. Ricotta cheese. Low-sodium or reduced-sodium cheeses. Yogurt.  Condiments Fresh and dried herbs and spices. Salt-free seasonings. Onion and garlic powders. Low-sodium varieties of mustard and ketchup. Lemon juice.  Fats and Oils Reduced-sodium salad dressings. Unsalted butter.  Other Unsalted popcorn and pretzels.  The items listed above may not be a complete list of recommended foods or beverages. Contact your dietitian for more  options. WHAT FOODS ARE NOT RECOMMENDED? Grains Instant hot cereals. Bread stuffing, pancake, and biscuit mixes. Croutons. Seasoned rice or pasta mixes. Noodle soup cups. Boxed or frozen macaroni and cheese. Self-rising flour. Regular salted crackers. Vegetables Regular canned vegetables. Regular canned tomato sauce and paste. Regular tomato and vegetable juices. Frozen vegetables in sauces. Salted french fries. Olives. Rosita Fire. Relishes. Sauerkraut. Salsa. Meat and Other Protein Products Salted, canned, smoked, spiced, or pickled meats, seafood, or fish. Bacon, ham, sausage, hot dogs, corned beef, chipped beef, and packaged luncheon meats. Salt pork. Jerky. Pickled herring. Anchovies, regular canned tuna, and sardines. Salted nuts.  Dairy Processed cheese and cheese spreads. Cheese curds. Blue cheese and cottage cheese. Buttermilk.  Condiments Onion and garlic salt, seasoned salt, table salt, and sea salt. Canned and packaged gravies. Worcestershire sauce. Tartar sauce. Barbecue sauce. Teriyaki sauce. Soy sauce, including reduced sodium. Steak sauce. Fish sauce. Oyster sauce. Cocktail sauce. Horseradish. Regular ketchup and mustard. Meat flavorings and tenderizers. Bouillon cubes. Hot sauce. Tabasco sauce. Marinades. Taco seasonings. Relishes. Fats and Oils Regular salad dressings. Salted butter. Margarine. Ghee. Bacon fat.  Other Potato and tortilla chips. Corn chips and puffs. Salted popcorn and pretzels. Canned or dried soups. Pizza. Frozen entrees and pot pies.  The items listed above may not be a complete list of foods and beverages to avoid. Contact your dietitian for more information. Document Released: 04/12/2002 Document Revised: 10/26/2013 Document Reviewed: 08/25/2013 [facility] Patient Information 2015 [place], [place]. This information is not intended to replace advice given to you by your health care provider. Make sure you discuss any questions you have with your health care  provider.   Diabetes Mellitus and Food It is important for you to manage your blood sugar (glucose) level. Your blood glucose level can be greatly affected by what you eat. Eating healthier foods in the appropriate amounts throughout the day at about the same time each day will help you control your blood glucose level. It can also help slow or prevent worsening of your diabetes mellitus. Healthy eating may even help you improve the level of your blood pressure and reach or maintain a healthy weight.  HOW CAN FOOD AFFECT ME? Carbohydrates Carbohydrates affect your blood glucose level more than any other type of food. Your dietitian will help you determine how many carbohydrates to eat at each meal and teach you how to count carbohydrates. Counting carbohydrates is important to keep your blood glucose at a healthy level, especially if you are using insulin or taking certain medicines for diabetes mellitus. Alcohol Alcohol can cause sudden decreases in blood glucose (hypoglycemia), especially if you use insulin or take certain medicines for diabetes mellitus. Hypoglycemia can be a life-threatening condition. Symptoms of hypoglycemia (sleepiness, dizziness, and disorientation) are similar to symptoms of having too much alcohol.  If your health care provider has given you approval to drink alcohol, do so in moderation and use the following guidelines:  Women should not have more than one drink per day, and men should not have more than two drinks per day. One drink is equal to:  12 oz of beer.  5 oz of wine.  1 oz of hard liquor.  Do not drink on an empty stomach.  Keep yourself hydrated. Have water, diet soda, or unsweetened iced tea.  Regular soda, juice, and other mixers might contain a lot of carbohydrates and should be counted. WHAT FOODS ARE NOT RECOMMENDED? As you make food choices, it is important to remember that all foods are not the same. Some foods have fewer nutrients per serving  than other foods, even though they might have the same number of calories or carbohydrates. It is difficult to get your body what it needs when you eat foods with fewer nutrients. Examples of foods that you should avoid that are high in calories and carbohydrates but low in nutrients include:  Trans fats (most processed foods list trans fats on the Nutrition Facts label).  Regular soda.  Juice.  Candy.  Sweets, such as cake, pie, doughnuts, and cookies.  Fried foods. WHAT FOODS CAN I EAT? Have nutrient-rich foods, which will nourish  your body and keep you healthy. The food you should eat also will depend on several factors, including:  The calories you need.  The medicines you take.  Your weight.  Your blood glucose level.  Your blood pressure level.  Your cholesterol level. You also should eat a variety of foods, including:  Protein, such as meat, poultry, fish, tofu, nuts, and seeds (lean animal proteins are best).  Fruits.  Vegetables.  Dairy products, such as milk, cheese, and yogurt (low fat is best).  Breads, grains, pasta, cereal, rice, and beans.  Fats such as olive oil, trans fat-free margarine, canola oil, avocado, and olives. DOES EVERYONE WITH DIABETES MELLITUS HAVE THE SAME MEAL PLAN? Because every person with diabetes mellitus is different, there is not one meal plan that works for everyone. It is very important that you meet with a dietitian who will help you create a meal plan that is just right for you. Document Released: 07/18/2005 Document Revised: 10/26/2013 Document Reviewed: 09/17/2013 [facility] Patient Information 2015 [place], [place]. This information is not intended to replace advice given to you by your health care provider. Make sure you discuss any questions you have with your health care provider.

## 2014-12-13 NOTE — Progress Notes (Signed)
HPI: FU coronary artery disease. Patient previously cared for in [facility]. Patient has had a prior inferior posterior myocardial infarction. Cardiac catheterization in 2000 showed an occluded right coronary artery and patient had PCI at that time. Ejection fraction was 50%. Previous monitor showed frequent PVCs. Carotid Dopplers in January 2010 showed less than 50% bilateral stenosis. Echo repeated 12/15 and showed EF 25, moderate LAE and mild MR. Cardiac catheterization December 2015 showed no significant coronary disease and ejection fraction 25%. Had ICD placed February 2016. Since he was last seen the patient denies any dyspnea on exertion, orthopnea, PND, pedal edema, palpitations, syncope or chest pain.   Current Outpatient Prescriptions  Medication Sig Dispense Refill  . aspirin 325 MG tablet Take 325 mg by mouth daily.    Marland Kitchen. buPROPion (WELLBUTRIN XL) 300 MG 24 hr tablet Take 450 mg by mouth daily.     . carvedilol (COREG) 25 MG tablet Take 1 tablet (25 mg total) by mouth 2 (two) times daily. 180 tablet 0  . clonazePAM (KLONOPIN) 1 MG tablet Take 1 mg by mouth 3 (three) times daily as needed for anxiety.    . Folic Acid-Vit B6-Vit B12 (FOLBEE) 2.5-25-1 MG TABS tablet Take 1 tablet by mouth daily. 90 tablet 0  . furosemide (LASIX) 40 MG tablet Take 1 tablet (40 mg total) by mouth as needed. (Patient taking differently: Take 40 mg by mouth daily as needed for fluid. ) 90 tablet 0  . gabapentin (NEURONTIN) 400 MG capsule Take 400 mg by mouth 3 (three) times daily.  10  . lamoTRIgine (LAMICTAL) 200 MG tablet Take 200 mg by mouth daily.     Marland Kitchen. losartan (COZAAR) 100 MG tablet Take 1 tablet (100 mg total) by mouth daily. 90 tablet 3  . metFORMIN (GLUCOPHAGE) 1000 MG tablet Take 1 tablet (1,000 mg total) by mouth 2 (two) times daily.  0  . modafinil (PROVIGIL) 200 MG tablet Take 200 mg by mouth daily.     . montelukast (SINGULAIR) 10 MG tablet Take 10 mg by mouth daily.     . nitroGLYCERIN  (NITROSTAT) 0.4 MG SL tablet Place 1 tablet (0.4 mg total) under the tongue every 5 (five) minutes as needed for chest pain. 25 tablet 3  . Oxcarbazepine (TRILEPTAL) 300 MG tablet Take 300-900 mg by mouth 2 (two) times daily. 1 tablet in the AM and 3 at night  0  . prazosin (MINIPRESS) 5 MG capsule Take 5 mg by mouth 2 (two) times daily.   0  . PROAIR HFA 108 (90 BASE) MCG/ACT inhaler Inhale 1-2 puffs into the lungs every 4 (four) hours as needed for wheezing or shortness of breath. Asthma  1  . risperiDONE (RISPERDAL) 1 MG tablet Take 1-2 mg by mouth 2 (two) times daily. Take 1 tablet in the morning and 2 tablets at night    . simvastatin (ZOCOR) 80 MG tablet Take 1 tablet (80 mg total) by mouth daily. 90 tablet 0  . spironolactone (ALDACTONE) 25 MG tablet Take 1 tablet (25 mg total) by mouth daily. 30 tablet 6  . zaleplon (SONATA) 10 MG capsule Take 10 mg by mouth at bedtime as needed for sleep.      No current facility-administered medications for this visit.     Past Medical History  Diagnosis Date  . CAD (coronary artery disease)   . OSA (obstructive sleep apnea)   . Obesity   . Bipolar disorder   . Diverticulitis   .  Ischemic cardiomyopathy     Past Surgical History  Procedure Laterality Date  . Right elbow surgery    . Left heart catheterization with coronary angiogram N/A 10/13/2014    Procedure: LEFT HEART CATHETERIZATION WITH CORONARY ANGIOGRAM;  Surgeon: Corky Crafts, MD;  Location[facility] C[facility] LAB;  Service: Cardiovascular;  Laterality: N/A;  . Implantable cardioverter defibrillator implant N/A 12/09/2014    Procedure: IMPLANTABLE CARDIOVERTER DEFIBRILLATOR IMPLANT;  Surgeon: Marinus Maw, MD;  Location: [facility] CATH LAB;  Service: Cardiovascular;  Laterality: N/A;    History   Social History  . Marital Status: Married    Spouse Name: N/A  . Number of Children: 2  . Years of Education: N/A   Occupational History  .     Social History Main Topics  . Smoking  status: Former Games developer  . Smokeless tobacco: Not on file  . Alcohol Use: Yes     Comment: Rarely  . Drug Use: Not on file  . Sexual Activity: Not on file   Other Topics Concern  . Not on file   Social History Narrative    ROS: no fevers or chills, productive cough, hemoptysis, dysphasia, odynophagia, melena, hematochezia, dysuria, hematuria, rash, seizure activity, orthopnea, PND, pedal edema, claudication. Remaining systems are negative.  Physical Exam: Well-developed obese in no acute distress.  Skin is warm and dry.  HEENT is normal.  Neck is supple.  Chest is clear to auscultation with normal expansion. ICD site with no hematoma and no evidence of infection. Cardiovascular exam is regular rate and rhythm.  Abdominal exam nontender or distended. No masses palpated. Extremities show no edema. neuro grossly intact

## 2014-12-14 ENCOUNTER — Encounter: Payer: Self-pay | Admitting: Cardiology

## 2014-12-14 ENCOUNTER — Encounter: Payer: Self-pay | Admitting: *Deleted

## 2014-12-14 ENCOUNTER — Ambulatory Visit (INDEPENDENT_AMBULATORY_CARE_PROVIDER_SITE_OTHER): Payer: BLUE CROSS/BLUE SHIELD | Admitting: Cardiology

## 2014-12-14 DIAGNOSIS — I255 Ischemic cardiomyopathy: Secondary | ICD-10-CM

## 2014-12-14 DIAGNOSIS — I2510 Atherosclerotic heart disease of native coronary artery without angina pectoris: Secondary | ICD-10-CM

## 2014-12-14 DIAGNOSIS — I2583 Coronary atherosclerosis due to lipid rich plaque: Principal | ICD-10-CM

## 2014-12-14 DIAGNOSIS — E785 Hyperlipidemia, unspecified: Secondary | ICD-10-CM

## 2014-12-14 DIAGNOSIS — I10 Essential (primary) hypertension: Secondary | ICD-10-CM | POA: Insufficient documentation

## 2014-12-14 LAB — HEPATIC FUNCTION PANEL
ALBUMIN: 4.1 g/dL (ref 3.5–5.2)
ALT: 39 U/L (ref 0–53)
AST: 19 U/L (ref 0–37)
Alkaline Phosphatase: 62 U/L (ref 39–117)
BILIRUBIN INDIRECT: 0.3 mg/dL (ref 0.2–1.2)
Bilirubin, Direct: 0.1 mg/dL (ref 0.0–0.3)
Total Bilirubin: 0.4 mg/dL (ref 0.2–1.2)
Total Protein: 6.2 g/dL (ref 6.0–8.3)

## 2014-12-14 LAB — LIPID PANEL
CHOL/HDL RATIO: 4 ratio
Cholesterol: 108 mg/dL (ref 0–200)
HDL: 27 mg/dL — AB (ref >39)
LDL Cholesterol: 56 mg/dL (ref 0–99)
TRIGLYCERIDES: 126 mg/dL (ref <150)
VLDL: 25 mg/dL (ref 0–40)

## 2014-12-14 LAB — CK: Total CK: 70 U/L (ref 7–232)

## 2014-12-14 MED ORDER — FUROSEMIDE 40 MG PO TABS: 40.0000 mg | ORAL_TABLET | ORAL | Status: DC | PRN

## 2014-12-14 MED ORDER — SIMVASTATIN 80 MG PO TABS: 80.0000 mg | ORAL_TABLET | Freq: Every day | ORAL | Status: DC

## 2014-12-14 MED ORDER — SPIRONOLACTONE 25 MG PO TABS: 25.0000 mg | ORAL_TABLET | Freq: Every day | ORAL | Status: DC

## 2014-12-14 MED ORDER — LOSARTAN POTASSIUM 100 MG PO TABS: 100.0000 mg | ORAL_TABLET | Freq: Every day | ORAL | Status: DC

## 2014-12-14 MED ORDER — NITROGLYCERIN 0.4 MG SL SUBL: 0.4000 mg | SUBLINGUAL_TABLET | SUBLINGUAL | Status: AC | PRN

## 2014-12-14 MED ORDER — CARVEDILOL 25 MG PO TABS: 25.0000 mg | ORAL_TABLET | Freq: Two times a day (BID) | ORAL | Status: DC

## 2014-12-14 NOTE — Assessment & Plan Note (Signed)
Discussed weight loss. Referred to cardiac rehabilitation.

## 2014-12-14 NOTE — Assessment & Plan Note (Signed)
Patient euvolemic on examination. Continue present dose of diuretics. 

## 2014-12-14 NOTE — Assessment & Plan Note (Signed)
Continue statin. Check lipids, liver and CK. 

## 2014-12-14 NOTE — Assessment & Plan Note (Signed)
Continue aspirin and statin. 

## 2014-12-14 NOTE — Assessment & Plan Note (Signed)
Blood pressure controlled. Continue present medications. 

## 2014-12-14 NOTE — Patient Instructions (Signed)
Your physician wants you to follow-up in: 6 MONTHS WITH DR CRENSHAW You will receive a reminder letter in the mail two months in advance. If you don't receive a letter, please call our office to schedule the follow-up appointment.   Your physician recommends that you HAVE LAB WORK TODAY 

## 2014-12-14 NOTE — Assessment & Plan Note (Signed)
Follow-up electrophysiology. 

## 2014-12-14 NOTE — Assessment & Plan Note (Signed)
Continue ARB, beta blocker and diuretics.

## 2014-12-15 ENCOUNTER — Encounter: Payer: Self-pay | Admitting: *Deleted

## 2014-12-19 ENCOUNTER — Ambulatory Visit: Payer: BLUE CROSS/BLUE SHIELD

## 2014-12-19 ENCOUNTER — Telehealth: Payer: Self-pay | Admitting: Internal Medicine
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
## 2014-12-20 ENCOUNTER — Ambulatory Visit (INDEPENDENT_AMBULATORY_CARE_PROVIDER_SITE_OTHER): Payer: BLUE CROSS/BLUE SHIELD | Admitting: *Deleted

## 2014-12-20 DIAGNOSIS — I255 Ischemic cardiomyopathy: Secondary | ICD-10-CM

## 2014-12-20 DIAGNOSIS — I5022 Chronic systolic (congestive) heart failure: Secondary | ICD-10-CM

## 2014-12-20 LAB — MDC_IDC_ENUM_SESS_TYPE_INCLINIC
Battery Voltage: 3.11 V
Brady Statistic RV Percent Paced: 0.01 %
HIGH POWER IMPEDANCE MEASURED VALUE: 19 Ohm
HighPow Impedance: 62 Ohm
Lead Channel Impedance Value: 532 Ohm
Lead Channel Sensing Intrinsic Amplitude: 22.5 mV
Lead Channel Setting Pacing Amplitude: 3.5 V
Lead Channel Setting Pacing Pulse Width: 0.4 ms
Lead Channel Setting Sensing Sensitivity: 0.3 mV
MDC IDC MSMT BATTERY REMAINING LONGEVITY: 137 mo
MDC IDC MSMT LEADCHNL RV PACING THRESHOLD AMPLITUDE: 0.75 V
MDC IDC MSMT LEADCHNL RV PACING THRESHOLD PULSEWIDTH: 0.4 ms
MDC IDC MSMT LEADCHNL RV SENSING INTR AMPL: 22.875 mV
MDC IDC SESS DTM: 20160216141726
Zone Setting Detection Interval: 300 ms
Zone Setting Detection Interval: 360 ms
Zone Setting Detection Interval: 450 ms

## 2014-12-20 NOTE — Telephone Encounter (Signed)
Patient scheduled for 2/18 @ 12pm for a wound check with the device clinic and family is aware.
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
## 2014-12-20 NOTE — Telephone Encounter (Signed)
Spoke with Rodman PickleCassidy and let her know to have him come in Thurs at 12:00

## 2014-12-20 NOTE — Progress Notes (Signed)

## 2014-12-21 ENCOUNTER — Ambulatory Visit: Payer: BLUE CROSS/BLUE SHIELD

## 2014-12-22 ENCOUNTER — Ambulatory Visit: Payer: BLUE CROSS/BLUE SHIELD

## 2015-01-02 ENCOUNTER — Ambulatory Visit: Payer: BLUE CROSS/BLUE SHIELD

## 2015-01-03 ENCOUNTER — Telehealth (HOSPITAL_COMMUNITY): Payer: Self-pay | Admitting: *Deleted

## 2015-01-05 ENCOUNTER — Telehealth: Payer: Self-pay | Admitting: Internal Medicine

## 2015-01-10 ENCOUNTER — Encounter: Payer: Self-pay | Admitting: Internal Medicine

## 2015-01-16 ENCOUNTER — Other Ambulatory Visit: Payer: Self-pay

## 2015-01-16 MED ORDER — FOLIC ACID-VIT B6-VIT B12 2.5-25-1 MG PO TABS: 1.0000 | ORAL_TABLET | Freq: Every day | ORAL | Status: DC

## 2015-01-27 ENCOUNTER — Telehealth: Payer: Self-pay | Admitting: Internal Medicine

## 2015-01-27 NOTE — Telephone Encounter (Signed)
VM left for pt FMLA signed and ready for Pick up  3.25.16/km

## 2015-03-21 ENCOUNTER — Encounter: Payer: Self-pay | Admitting: Internal Medicine

## 2015-03-21 ENCOUNTER — Ambulatory Visit (INDEPENDENT_AMBULATORY_CARE_PROVIDER_SITE_OTHER): Payer: BLUE CROSS/BLUE SHIELD | Admitting: Internal Medicine

## 2015-03-21 VITALS — BP 136/87 | HR 78 | Ht 73.0 in | Wt 298.4 lb

## 2015-03-21 DIAGNOSIS — I10 Essential (primary) hypertension: Secondary | ICD-10-CM | POA: Diagnosis not present

## 2015-03-21 DIAGNOSIS — I5022 Chronic systolic (congestive) heart failure: Secondary | ICD-10-CM

## 2015-03-21 DIAGNOSIS — Z95810 Presence of automatic (implantable) cardiac defibrillator: Secondary | ICD-10-CM | POA: Diagnosis not present

## 2015-03-21 DIAGNOSIS — I255 Ischemic cardiomyopathy: Secondary | ICD-10-CM

## 2015-03-21 DIAGNOSIS — E669 Obesity, unspecified: Secondary | ICD-10-CM

## 2015-03-21 LAB — CUP PACEART INCLINIC DEVICE CHECK
Battery Remaining Longevity: 136 mo
Battery Voltage: 3.09 V
Brady Statistic RV Percent Paced: 0.01 %
HIGH POWER IMPEDANCE MEASURED VALUE: 75 Ohm
HighPow Impedance: 19 Ohm
Lead Channel Impedance Value: 532 Ohm
Lead Channel Sensing Intrinsic Amplitude: 25.875 mV
Lead Channel Setting Pacing Amplitude: 2.5 V
Lead Channel Setting Pacing Pulse Width: 0.4 ms
MDC IDC MSMT LEADCHNL RV PACING THRESHOLD AMPLITUDE: 0.875 V
MDC IDC MSMT LEADCHNL RV PACING THRESHOLD PULSEWIDTH: 0.4 ms
MDC IDC SESS DTM: 20160517124000
MDC IDC SET LEADCHNL RV SENSING SENSITIVITY: 0.3 mV
MDC IDC SET ZONE DETECTION INTERVAL: 360 ms
Zone Setting Detection Interval: 300 ms
Zone Setting Detection Interval: 450 ms

## 2015-03-21 NOTE — Assessment & Plan Note (Signed)
His medtronic ICD is working normally. Will followup in several months.

## 2015-03-21 NOTE — Assessment & Plan Note (Signed)
His symptoms have worsened. I have asked that he take his lasix daily for the next 3 days and after that to weigh himself and take lasix on the days that his weight is up.

## 2015-03-21 NOTE — Patient Instructions (Addendum)
Medication Instructions:  Your physician has recommended you make the following change in your medication:   1) Take your Furosemide  40 mg for 3 days   Labwork: None ordered  Testing/Procedures: None  ordered  Follow-Up: Your physician wants you to follow-up in: 12 months with Dr Court Joyaylor You will receive a reminder letter in the mail two months in advance. If you don't receive a letter, please call our office to schedule the follow-up appointment.  Remote monitoring is used to monitor your Pacemaker of ICD from home. This monitoring reduces the number of office visits required to check your device to one time per year. It allows us to keep an eye on the functioning of your device to ensure it is working properly. You are scheduled for a device check from home on 06/20/15. You may send your transmission at any time that day. If you have a wireless device, the transmission will be sent automatically. After your physician reviews your transmission, you will receive a postcard with your next transmission date.  Sherri RadHeather McGhee, RN will call you about ICM clinic--monthly monitoring to check fluid levels    Any Other Special Instructions Will Be Listed Below (If Applicable).  Weigh yourself daily

## 2015-03-21 NOTE — Progress Notes (Signed)
HPI [name] returns today for ICD implantation. He is a pleasant 44 yo man with an ICM, s/p MI in 2000. He has had a reduction in his EF from 34% to 25% over the past year. Despite this, he has not had chest pain. He works full time in a very strenuous job, carrying heavy objects and walking up stairs. He is tired at the end of the day. He has not had syncope. He thinks that his dyspnea has gotten worse but he is not taking lasix because he has had little swelling in his legs.   .No Known Allergies   Current Outpatient Prescriptions  Medication Sig Dispense Refill  . aspirin 325 MG tablet Take 325 mg by mouth daily.    . carvedilol (COREG) 25 MG tablet Take 1 tablet (25 mg total) by mouth 2 (two) times daily. 60 tablet 11  . clonazePAM (KLONOPIN) 1 MG tablet Take 1 mg by mouth 3 (three) times daily.     . Folic Acid-Vit B6-Vit B12 (FOLBEE) 2.5-25-1 MG TABS tablet Take 1 tablet by mouth daily. 90 tablet 0  . furosemide (LASIX) 40 MG tablet Take 1 tablet (40 mg total) by mouth as needed. 30 tablet 11  . gabapentin (NEURONTIN) 400 MG capsule Take 400 mg by mouth 3 (three) times daily.  10  . lamoTRIgine (LAMICTAL) 200 MG tablet Take 200 mg by mouth daily.     Marland Kitchen. losartan (COZAAR) 100 MG tablet Take 1 tablet (100 mg total) by mouth daily. 30 tablet 11  . metFORMIN (GLUCOPHAGE) 1000 MG tablet Take 1 tablet (1,000 mg total) by mouth 2 (two) times daily. (Patient taking differently: Take 500 mg by mouth 2 (two) times daily. )  0  . modafinil (PROVIGIL) 200 MG tablet Take 400 mg by mouth daily as needed.     . montelukast (SINGULAIR) 10 MG tablet Take 10 mg by mouth daily.     . nitroGLYCERIN (NITROSTAT) 0.4 MG SL tablet Place 1 tablet (0.4 mg total) under the tongue every 5 (five) minutes as needed for chest pain. 25 tablet 11  . Oxcarbazepine (TRILEPTAL) 300 MG tablet Take 300-900 mg by mouth 2 (two) times daily. 1 tablet in the AM and 3 at night  0  . PROAIR HFA 108 (90 BASE) MCG/ACT  inhaler Inhale 1-2 puffs into the lungs every 4 (four) hours as needed for wheezing or shortness of breath. Asthma  1  . risperiDONE (RISPERDAL) 1 MG tablet Take 1-2 mg by mouth 2 (two) times daily. Take 1 tablet in the morning and 2 tablets at night    . simvastatin (ZOCOR) 80 MG tablet Take 1 tablet (80 mg total) by mouth daily. 30 tablet 11  . spironolactone (ALDACTONE) 25 MG tablet Take 1 tablet (25 mg total) by mouth daily. 30 tablet 11  . zaleplon (SONATA) 10 MG capsule Take 10 mg by mouth at bedtime as needed for sleep.      No current facility-administered medications for this visit.     Past Medical History  Diagnosis Date  . CAD (coronary artery disease)   . OSA (obstructive sleep apnea)   . Obesity   . Bipolar disorder   . Diverticulitis   . Ischemic cardiomyopathy     ROS:   All systems reviewed and negative except as noted in the HPI.   Past Surgical History  Procedure Laterality Date  . Right elbow surgery    . Left heart catheterization with coronary  angiogram N/A 10/13/2014    Procedure: LEFT HEART CATHETERIZATION WITH CORONARY ANGIOGRAM;  Surgeon: Corky CraftsJayadeep S Varanasi, MD;  [facility];  Service: Cardiovascular;  Laterality: N/A;  . Implantable cardioverter defibrillator implant N/A 12/09/2014    Procedure: IMPLANTABLE CARDIOVERTER DEFIBRILLATOR IMPLANT;  Surgeon: Marinus MawGregg W Taylor, MD;  [facility];  Service: Cardiovascular;  Laterality: N/A;     Family History  Problem Relation Age of Onset  . CAD Mother     MI at age 46  . Alcohol abuse Mother   . Colon cancer Father   . Diabetes Father      History   Social History  . Marital Status: Married    Spouse Name: N/A  . Number of Children: 2  . Years of Education: N/A   Occupational History  .     Social History Main Topics  . Smoking status: Former Games developermoker  . Smokeless tobacco: Not on file  . Alcohol Use: Yes     Comment: Rarely  . Drug Use: Not on file  . Sexual Activity: Not  on file   Other Topics Concern  . Not on file   Social History Narrative     BP 136/87 mmHg  Pulse 78  Ht 6\' 1"  (1.854 m)  Wt 298 lb 6.4 oz (135.353 kg)  BMI 39.38 kg/m2  SpO2 99%  Physical Exam:  Obese apearring middle aged man, NAD HEENT: Unremarkable Neck:  7 cm JVD, no thyromegally Back:  No CVA tenderness Lungs:  Clear with no wheezes, rales, or rhonchi HEART:  Regular rate rhythm, no murmurs, no rubs, no clicks Abd:  soft, positive bowel sounds, no organomegally, no rebound, no guarding Ext:  2 plus pulses, no edema, no cyanosis, no clubbing Skin:  No rashes no nodules Neuro:  CN II through XII intact, motor grossly intact    Assess/Plan:

## 2015-03-21 NOTE — Assessment & Plan Note (Signed)
His blood pressure is fairly well controlled. I have asked him to lose weight and reduce his sodium intake.

## 2015-03-21 NOTE — Assessment & Plan Note (Signed)
He is encouraged to weigh himself daily. Will follow.

## 2015-03-22 ENCOUNTER — Telehealth: Payer: Self-pay | Admitting: *Deleted

## 2015-03-22 NOTE — Telephone Encounter (Signed)
I called and spoke with the patient's wife- [facility]

## 2015-03-27 ENCOUNTER — Telehealth: Payer: Self-pay | Admitting: Cardiology

## 2015-03-27 DIAGNOSIS — Z79899 Other long term (current) drug therapy: Secondary | ICD-10-CM

## 2015-03-27 NOTE — Telephone Encounter (Signed)
Dosing instructions explained to patient. Instructed to get BMET in 1 week. This labwork ordered for [facility] per patient preference.

## 2015-03-27 NOTE — Telephone Encounter (Signed)
Patient called today. He wanted to know how to take or if take Lasix anymore. Patient had an appointment with Dr Ladona Ridgelaylor on 03/21/15.  Patient states his ICD information showed a spike in fluid from April til now and Dr Ladona Ridgelaylor heard fluid in his lungs. Lasix 40 mg daily  X 3 days. Patient states he lost 20 lbs in that period of time. His weight on last Tuesday 308 lbs to today 285 lbs.  patient did have asthma attack on Friday and was in the ER.  RN informed patient to continue to weigh daily and if weight is 3-5 lbs overnight. Contact office. Patient's is aware will defer to Dr Ladona RidgelAYLOR and Dr Jens SomRENSHAW concerning prn or daily dosing of lasix.

## 2015-03-27 NOTE — Telephone Encounter (Signed)
Please call,question about his Lasix.

## 2015-03-27 NOTE — Telephone Encounter (Signed)
Take lasix 20 mg daily and additional 20 mg daily for weight gain of 2-3 lbs; bmet one week Todd MillersBrian Jackson

## 2015-03-31 ENCOUNTER — Telehealth: Payer: Self-pay | Admitting: Internal Medicine

## 2015-03-31 NOTE — Telephone Encounter (Signed)
Left patient several message's FMLA ready for pick up no return phone call FMLA mailed to pt home address.

## 2015-04-05 ENCOUNTER — Telehealth: Payer: Self-pay | Admitting: Internal Medicine

## 2015-04-05 NOTE — Telephone Encounter (Addendum)
He stopped Furosemide 3 days ago  His weight is stable(down 20 pounds) as he is weighing himself daily.  He was due to have a BMP today and did not as he feels extremely weak.  I have asked him to have this done tomorrow and to send a transmission so we check his fluid status.  I will call him tomorrow after his labs are resulted.

## 2015-04-05 NOTE — Telephone Encounter (Signed)
Pt c/o medication issue:  1. Name of Medication: Lasix  2. How are you currently taking this medication (dosage and times per day)? 20 mg 1x per day  3. Are you having a reaction (difficulty breathing--STAT)? No  4. What is your medication issue? Pt calling stating that he stopped taking it 3 or 4 days ago and he has lost 20-30 lbs since being on it, he is feeling dehydrated, weak, cold sweats, and tired. Please call back and advise.

## 2015-04-06 NOTE — Telephone Encounter (Signed)
Patient did transmit 04/05/15- He looks a little dry over the last few days. His labs are still pending.

## 2015-04-07 LAB — BASIC METABOLIC PANEL
BUN: 12 mg/dL (ref 6–23)
CHLORIDE: 101 meq/L (ref 96–112)
CO2: 20 meq/L (ref 19–32)
CREATININE: 1.11 mg/dL (ref 0.50–1.35)
Calcium: 8.9 mg/dL (ref 8.4–10.5)
GLUCOSE: 99 mg/dL (ref 70–99)
POTASSIUM: 4.7 meq/L (ref 3.5–5.3)
SODIUM: 133 meq/L — AB (ref 135–145)

## 2015-04-07 NOTE — Telephone Encounter (Signed)
Spoke with patienta nd let him know labs were ok.  He is going to only take fluid pills as needed

## 2015-04-18 ENCOUNTER — Telehealth: Payer: Self-pay | Admitting: Cardiology

## 2015-04-18 NOTE — Telephone Encounter (Signed)
If needs sooner appt will need to be seen by pa, otherwise fu with me as scheduled Olga Millers

## 2015-04-18 NOTE — Telephone Encounter (Signed)
Pt's wife called in stating that the pt has been very weak for the past several weeks and she would like to be advised on what to do . He has been to his PCP and urgent care the the doctors were unable to find anything . Please call  Thanks

## 2015-04-18 NOTE — Telephone Encounter (Signed)
Spoke to wife of patient. She reports pt feeling very "weak", tired/fatigued x several weeks.  Has had 2 visits to Urgent Care, 1 visit to ED for this and CP. No specific indications given at any of these visits to explain his problems.  Wife concerned about progression of heart failure, among other concerns. Asked about CPAP - she reports adequate sleep ---- occasional waking at night but this has been ongoing, more attributing this to his "nightmares" which have been a longstanding problem.  He is set up to see his sleep specialist in September - encouraged her to see if CPAP settings can be checked prior to that.  Recent labwork unremarkable. Pt denies recent weight gain. Denies SOB.   Pt set up to see Dr. Jens Som July 27th. Wondered if he can be seen sooner.

## 2015-04-19 NOTE — Telephone Encounter (Signed)
Spoke with susan, she reports the patient passed out yesterday at work and would like for him to be seen asap. Patient will see scott we[facility] pa tomorrow at 2 pm. Patient voiced understanding of appt time and location

## 2015-04-19 NOTE — Progress Notes (Signed)
Cardiology Office Note   Date:  04/20/2015   ID:  Todd Jackson, DOB 1971-07-03, MRN [id number]  PCP:  PROVIDER NOT IN SYSTEM [facility] Cardiologist:  Dr. Olga Millers   Electrophysiologist:  Dr. Lewayne Bunting   Chief Complaint  Patient presents with  . Loss of Consciousness  . Congestive Heart Failure     History of Present Illness: Todd Jackson is a 44 y.o. male with a hx of prior inferior posterior myocardial infarction. Cardiac catheterization in 2000 showed an occluded right coronary artery and patient had PCI at that time. Ejection fraction was 50%. Previous monitor showed frequent PVCs. Carotid Dopplers in January 2010 showed less than 50% bilateral stenosis. Echo repeated 12/15 and showed EF 25%, moderate LAE and mild MR. Cardiac catheterization December 2015 showed no significant coronary disease and ejection fraction 25%. Had ICD placed February 2016. Last seen by Dr. Olga Millers 12/14/14.  Last seen by Dr. Lewayne Bunting 03/21/15.  ICD was functioning appropriately.  No VT documented.    Patient called in recently with complaints of weakness, fatigue and chest pain. He had apparently been seen at an urgent care or the emergency room for these symptoms. Appointment was scheduled with Dr. Jens Som in July. He then called back to the office with reported episode of syncope. He is seen for urgent evaluation.  He did have a significant weight decrease after taking Lasix for several days.  He felt weak with this and called the office.  He was instructed to decrease this to 20 mg QD.  He took this for several days.  He called back with continued weakness and was instructed to stop.  He has continued to feel weak.  He had an episode of chest pain last month. He has asthma and this felt like an asthma exacerbation.  He had improvement with bronchodilators.  He was sent to the ED by his PCP.  He went to [facility].  He describes having serial CEs and a CT. He was told  everything was neg and he was sent home.  He denies any further chest pain.  He is quite active at work. He works at [employer].  He has to do a lot of pushing and lifting and goes up and down steps.  He denies exertional CP.  Denies significant DOE.  He is NYHA 2-2b.  He denies orthopnea.  Sleeps with CPAP.  No edema.  He has chronic diarrhea from Metformin.  Diarrhea worsened with Lasix.  He was at work 2 days ago and while standing developed near syncope.  He did not completely lose consciousness.  This went on for 5 minutes. He did not fall or injure himself.  He felt nauseated afterward.  He still feels weak. No further near syncope.     Studies/Reports Reviewed Today:  Echo 10/04/14 - Septal , apical and inferior wall hypokinesis.EF 25%. - Mitral valve: There was mild regurgitation. - Left atrium: The atrium was moderately dilated. - Atrial septum: No defect or patent foramen ovale was identified. - Pulmonary arteries: PA peak pressure: 35 mm Hg (S).  LHC 10/13/14 LM:  Widely patent. LAD: widely patent. LCx:  There is only mild disease in the circumflex system. RCA:  The stent in the mid vessel is widely patent. There is mild disease in the distal RCA.  EF:  25%, inferior akinesis. LVEDP was 32 mmHg.  Myoview 11/17/13 High risk stress nuclear study with a large, severe intensity, fixed inferior defect consistent with prior  inferior infarct; no ischemia; study high risk due to decreased LV function.  LV Ejection Fraction: 34%. LV Wall Motion: Inferior akinesis.  Carotid US 11/29/08 Bilateral ICA < 50%  Past Medical History  Diagnosis Date  . CAD (coronary artery disease)   . OSA (obstructive sleep apnea)   . Obesity   . Bipolar disorder   . Diverticulitis   . Ischemic cardiomyopathy     Past Surgical History  Procedure Laterality Date  . Right elbow surgery    . Left heart catheterization with coronary angiogram N/A 10/13/2014    Procedure: LEFT HEART CATHETERIZATION  WITH CORONARY ANGIOGRAM;  Surgeon: Corky Crafts, MD;  Location: [facility] CATH LAB;  Service: Cardiovascular;  Laterality: N/A;  . Implantable cardioverter defibrillator implant N/A 12/09/2014    Procedure: IMPLANTABLE CARDIOVERTER DEFIBRILLATOR IMPLANT;  Surgeon: Marinus Maw, MD;  Location: [facility] CATH LAB;  Service: Cardiovascular;  Laterality: N/A;     Current Outpatient Prescriptions  Medication Sig Dispense Refill  . aspirin 325 MG tablet Take 325 mg by mouth daily.    . carvedilol (COREG) 25 MG tablet Take 1 tablet (25 mg total) by mouth 2 (two) times daily. 60 tablet 11  . clonazePAM (KLONOPIN) 1 MG tablet Take 1 mg by mouth 3 (three) times daily.     . Folic Acid-Vit B6-Vit B12 (FOLBEE) 2.5-25-1 MG TABS tablet Take 1 tablet by mouth daily. 90 tablet 0  . furosemide (LASIX) 40 MG tablet Take 1 tablet (40 mg total) by mouth as needed. 30 tablet 11  . gabapentin (NEURONTIN) 300 MG capsule Take 300 mg by mouth 2 (two) times daily.    Marland Kitchen lamoTRIgine (LAMICTAL) 200 MG tablet Take 200 mg by mouth at bedtime.     Marland Kitchen losartan (COZAAR) 50 MG tablet Take 1 tablet (50 mg total) by mouth daily. 30 tablet 11  . metFORMIN (GLUCOPHAGE) 1000 MG tablet Take 1 tablet (1,000 mg total) by mouth 2 (two) times daily. (Patient taking differently: Take 500 mg by mouth 2 (two) times daily. )  0  . modafinil (PROVIGIL) 200 MG tablet Take 400 mg by mouth daily as needed.     . montelukast (SINGULAIR) 10 MG tablet Take 10 mg by mouth daily.     . nitroGLYCERIN (NITROSTAT) 0.4 MG SL tablet Place 1 tablet (0.4 mg total) under the tongue every 5 (five) minutes as needed for chest pain. 25 tablet 11  . Oxcarbazepine (TRILEPTAL) 300 MG tablet Take 300-900 mg by mouth 2 (two) times daily. 1 tablet in the AM and 3 at night  0  . PROAIR HFA 108 (90 BASE) MCG/ACT inhaler Inhale 1-2 puffs into the lungs every 4 (four) hours as needed for wheezing or shortness of breath. Asthma  1  . simvastatin (ZOCOR) 80 MG tablet Take 1 tablet  (80 mg total) by mouth daily. 30 tablet 11  . spironolactone (ALDACTONE) 25 MG tablet Take 0.5 tablets (12.5 mg total) by mouth daily.     No current facility-administered medications for this visit.    Allergies:   Review of patient's allergies indicates no known allergies.    Social History:  The patient  reports that he has quit smoking. He does not have any smokeless tobacco history on file. He reports that he drinks alcohol.   Family History:  The patient's family history includes Alcohol abuse in his mother; CAD in his mother; Colon cancer in his father; Diabetes in his father.    ROS:   Please see  the history of present illness.   Review of Systems  Constitution: Positive for malaise/fatigue.  Cardiovascular: Positive for chest pain and dyspnea on exertion.  All other systems reviewed and are negative.     PHYSICAL EXAM: VS:  BP 123/72 mmHg  Pulse 79  Ht 6\' 1"  (1.854 m)  Wt 283 lb (128.368 kg)  BMI 37.35 kg/m2    Orthostatic VS for the past 24 hrs:  BP- Lying Pulse- Lying BP- Sitting Pulse- Sitting BP- Standing at 0 minutes Pulse- Standing at 0 minutes  04/20/15 1416 123/72 mmHg 78 115/70 mmHg 84 90/50 mmHg 97    Wt Readings from Last 3 Encounters:  04/20/15 283 lb (128.368 kg)  03/21/15 298 lb 6.4 oz (135.353 kg)  12/14/14 309 lb (140.161 kg)     GEN: Well nourished, well developed, in no acute distress HEENT: normal Neck: no JVD,  no masses Cardiac:  Normal S1/S2, RRR; no murmur, no rubs or gallops, no edema   Respiratory:  clear to auscultation bilaterally, no wheezing, rhonchi or rales. GI: soft, nontender, nondistended, + BS MS: no deformity or atrophy Skin: warm and dry  Neuro:  CNs II-XII intact, Strength and sensation are intact Psych: Normal affect   EKG:  EKG is ordered today.  It demonstrates:   NSR, HR 79, normal axis, inferior Q waves, nonspecific ST-T wave changes, no significant change when compared to prior tracing  Device  Interrogation: Device interrogation today demonstrates no ventricular episodes. No ATP or ICD shock. OptiVol measurements with normal fluid volumes.  Recent Labs: 12/09/2014: Hemoglobin 13.0; Platelets 190 12/14/2014: ALT 39 04/06/2015: BUN 12; Creat 1.11; Potassium 4.7; Sodium 133*    Lipid Panel    Component Value Date/Time   CHOL 108 12/14/2014 1003   TRIG 126 12/14/2014 1003   HDL 27* 12/14/2014 1003   CHOLHDL 4.0 12/14/2014 1003   VLDL 25 12/14/2014 1003   LDLCALC 56 12/14/2014 1003      ASSESSMENT AND PLAN:  Near syncope:  He had an episode of severe near syncope 2 days ago.  He was recently diuresed with Lasix. Also had increased amounts of diarrhea. 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 His device did not show any arrhythmias.  He did not receive any therapies.  OptiVol measurements are below fluid excess thresholds.  He has a significant BP drop from lying to standing.  I suspect he has been dehydrated.  I will obtain BMET, CBC, TSH today.  Hold Spironolactone x 2 days.  Then resume at Spironolactone 12.5 mg QD.  Decrease Cozaar to 50 mg QD. Continue to hydrate.  Keep FU with Dr. Olga Millers next month.  Return sooner if he continues to feel weak or has a recurrent episode of near syncope.    Chronic systolic heart failure:  Volume stable.  Continue current Rx. We discussed the importance of daily weights.  He can take Lasix 20 mg if weight increases 3 lbs in 1 day or 5 lbs 1 week.   Cardiomyopathy, ischemic:  Adjust medications as noted.  Continue beta-blocker, angiotensin receptor blocker, spironolactone.  Coronary artery disease involving native coronary artery of native heart without angina pectoris:  Patent stent in RCA in 10/2014.  Recent trip to ED in [place] from asthma exacerbation.  No exertional angina.  Continue ASA, statin, beta-blocker, angiotensin receptor blocker.    S/P ICD (internal cardiac defibrillator) procedure:  FU with EP as planned.   Essential  hypertension:  Orthostatic hypotension today.  Adjust medications as noted.   Hyperlipidemia :  Continue statin.    Medication Changes: Current medicines are reviewed at length with the patient today.  Concerns regarding medicines are as outlined above.  The following changes have been made:   Discontinued Medications   GABAPENTIN (NEURONTIN) 400 MG CAPSULE    Take 400 mg by mouth 3 (three) times daily.   RISPERIDONE (RISPERDAL) 1 MG TABLET    Take 1-2 mg by mouth 2 (two) times daily. Take 1 tablet in the morning and 2 tablets at night   ZALEPLON (SONATA) 10 MG CAPSULE    Take 10 mg by mouth at bedtime as needed for sleep.    Modified Medications   Modified Medication Previous Medication   LOSARTAN (COZAAR) 50 MG TABLET losartan (COZAAR) 100 MG tablet      Take 1 tablet (50 mg total) by mouth daily.    Take 1 tablet (100 mg total) by mouth daily.   SPIRONOLACTONE (ALDACTONE) 25 MG TABLET spironolactone (ALDACTONE) 25 MG tablet      Take 0.5 tablets (12.5 mg total) by mouth daily.    Take 1 tablet (25 mg total) by mouth daily.   New Prescriptions   No medications on file     Labs/ tests ordered today include:   Orders Placed This Encounter  Procedures  . Basic Metabolic Panel (BMET)  . CBC w/Diff  . TSH  . EKG 12-Lead     Disposition:   FU with Dr. Olga Millers next month as planned.    Signed, Brynda Rim, MHS 04/20/2015 3:26 PM    [facility] [address] [place], [place], [place]  [address] Phone: [phone]; Fax: [phone]

## 2015-04-20 ENCOUNTER — Ambulatory Visit (INDEPENDENT_AMBULATORY_CARE_PROVIDER_SITE_OTHER): Payer: BLUE CROSS/BLUE SHIELD | Admitting: *Deleted

## 2015-04-20 ENCOUNTER — Telehealth: Payer: Self-pay | Admitting: *Deleted

## 2015-04-20 ENCOUNTER — Ambulatory Visit (INDEPENDENT_AMBULATORY_CARE_PROVIDER_SITE_OTHER): Payer: BLUE CROSS/BLUE SHIELD | Admitting: Physician Assistant

## 2015-04-20 ENCOUNTER — Encounter: Payer: Self-pay | Admitting: Physician Assistant

## 2015-04-20 ENCOUNTER — Encounter: Payer: Self-pay | Admitting: Internal Medicine

## 2015-04-20 VITALS — BP 123/72 | HR 79 | Ht 73.0 in | Wt 283.0 lb

## 2015-04-20 DIAGNOSIS — I5022 Chronic systolic (congestive) heart failure: Secondary | ICD-10-CM | POA: Diagnosis not present

## 2015-04-20 DIAGNOSIS — I2510 Atherosclerotic heart disease of native coronary artery without angina pectoris: Secondary | ICD-10-CM

## 2015-04-20 DIAGNOSIS — I2583 Coronary atherosclerosis due to lipid rich plaque: Secondary | ICD-10-CM

## 2015-04-20 DIAGNOSIS — Z95810 Presence of automatic (implantable) cardiac defibrillator: Secondary | ICD-10-CM

## 2015-04-20 DIAGNOSIS — R55 Syncope and collapse: Secondary | ICD-10-CM | POA: Diagnosis not present

## 2015-04-20 DIAGNOSIS — I255 Ischemic cardiomyopathy: Secondary | ICD-10-CM | POA: Diagnosis not present

## 2015-04-20 DIAGNOSIS — E785 Hyperlipidemia, unspecified: Secondary | ICD-10-CM

## 2015-04-20 DIAGNOSIS — I10 Essential (primary) hypertension: Secondary | ICD-10-CM

## 2015-04-20 LAB — CBC WITH DIFFERENTIAL/PLATELET
Basophils Absolute: 0 10*3/uL (ref 0.0–0.1)
Basophils Relative: 0.2 % (ref 0.0–3.0)
Eosinophils Absolute: 0.2 10*3/uL (ref 0.0–0.7)
Eosinophils Relative: 2.3 % (ref 0.0–5.0)
HCT: 40.4 % (ref 39.0–52.0)
Hemoglobin: 13.6 g/dL (ref 13.0–17.0)
LYMPHS PCT: 25.8 % (ref 12.0–46.0)
Lymphs Abs: 1.9 10*3/uL (ref 0.7–4.0)
MCHC: 33.7 g/dL (ref 30.0–36.0)
MCV: 90.6 fl (ref 78.0–100.0)
Monocytes Absolute: 0.6 10*3/uL (ref 0.1–1.0)
Monocytes Relative: 8.4 % (ref 3.0–12.0)
NEUTROS PCT: 63.3 % (ref 43.0–77.0)
Neutro Abs: 4.6 10*3/uL (ref 1.4–7.7)
Platelets: 263 10*3/uL (ref 150.0–400.0)
RBC: 4.46 Mil/uL (ref 4.22–5.81)
RDW: 15.9 % — ABNORMAL HIGH (ref 11.5–15.5)
WBC: 7.3 10*3/uL (ref 4.0–10.5)

## 2015-04-20 LAB — BASIC METABOLIC PANEL
BUN: 16 mg/dL (ref 6–23)
CALCIUM: 9.5 mg/dL (ref 8.4–10.5)
CHLORIDE: 102 meq/L (ref 96–112)
CO2: 28 mEq/L (ref 19–32)
CREATININE: 1.02 mg/dL (ref 0.40–1.50)
GFR: 83.52 mL/min (ref >60.00)
GLUCOSE: 96 mg/dL (ref 70–99)
Potassium: 4.2 mEq/L (ref 3.5–5.1)
Sodium: 137 mEq/L (ref 135–145)

## 2015-04-20 LAB — CUP PACEART INCLINIC DEVICE CHECK
Battery Voltage: 3.09 V
Date Time Interrogation Session: 20160616171427
HIGH POWER IMPEDANCE MEASURED VALUE: 19 Ohm
HIGH POWER IMPEDANCE MEASURED VALUE: 79 Ohm
Lead Channel Impedance Value: 532 Ohm
Lead Channel Pacing Threshold Amplitude: 0.75 V
Lead Channel Pacing Threshold Pulse Width: 0.4 ms
Lead Channel Sensing Intrinsic Amplitude: 29.125 mV
Lead Channel Setting Pacing Amplitude: 2 V
Lead Channel Setting Pacing Pulse Width: 0.4 ms
Lead Channel Setting Sensing Sensitivity: 0.3 mV
MDC IDC MSMT BATTERY REMAINING LONGEVITY: 135 mo
MDC IDC STAT BRADY RV PERCENT PACED: 0.01 %
Zone Setting Detection Interval: 300 ms
Zone Setting Detection Interval: 360 ms
Zone Setting Detection Interval: 450 ms

## 2015-04-20 LAB — TSH: TSH: 2.59 u[IU]/mL (ref 0.35–4.50)

## 2015-04-20 MED ORDER — SPIRONOLACTONE 25 MG PO TABS: 12.5000 mg | ORAL_TABLET | Freq: Every day | ORAL | Status: DC

## 2015-04-20 MED ORDER — LOSARTAN POTASSIUM 50 MG PO TABS: 50.0000 mg | ORAL_TABLET | Freq: Every day | ORAL | Status: DC

## 2015-04-20 NOTE — Telephone Encounter (Signed)
Pt notified of lab results with verbal understanding by phone. 

## 2015-04-20 NOTE — Progress Notes (Signed)
ICD check in clinic for syncope, added-on with Tereso Newcomer, PA. Normal device function. Thresholds and sensing consistent with previous device measurements. Impedance trends stable over time. No evidence of any ventricular arrhythmias. Histogram distribution appropriate for patient and level of activity. No changes made this session. Device programmed at appropriate safety margins. Device programmed to optimize intrinsic conduction. Estimated longevity 11.2 years. Pt enrolled in remote follow-up. ICM clinic following OptiVol, stable at this time. Patient education completed including shock plan. [facility] on 07/20/2015, follow-up with Dr. Jens Som as scheduled and ROV with GT in 03/2016 as scheduled.

## 2015-04-20 NOTE — Patient Instructions (Signed)
Medication Instructions:  1. HOLD ALDACTONE FOR 2 DAYS THEN DECREASE TO 12.5 MG DAILY (THIS WILL BE 1/2 TAB DAILY)  2. DECREASE COZAAR TO 50 MG DAILY; NEW RX SENT IN  Labwork: TODAY BMET, CBC W/DIFF, TSH  Testing/Procedures: NONE  Follow-Up: KEEP YOUR FOLLOW UP WITH DR. CRENSHAW IN 05/2015  Any Other Special Instructions Will Be Listed Below (If Applicable). MONITOR WEIGHT DAILY AND TAKE LASIX 20 MG IF YOUR WEIGHT IS UP 3 LB'S IN 1 DAY OR 5 LB'S IN 1 WEEK

## 2015-04-26 ENCOUNTER — Telehealth: Payer: Self-pay | Admitting: Physician Assistant

## 2015-04-26 DIAGNOSIS — I2583 Coronary atherosclerosis due to lipid rich plaque: Principal | ICD-10-CM

## 2015-04-26 DIAGNOSIS — E785 Hyperlipidemia, unspecified: Secondary | ICD-10-CM

## 2015-04-26 DIAGNOSIS — I2510 Atherosclerotic heart disease of native coronary artery without angina pectoris: Secondary | ICD-10-CM

## 2015-04-26 DIAGNOSIS — I255 Ischemic cardiomyopathy: Secondary | ICD-10-CM

## 2015-04-26 NOTE — Telephone Encounter (Signed)
Meds were adjusted due to low BP and drop in BP with standing.   Need him to check his BP and let us know what it is running - see if he can check sitting and again standing.   If still low may need to reduce meds further. He was not feeling well when I saw him. Does he feel worse or better with reducing the meds? If feeling worse, needs FU office visit with Dr. Olga Millers or me. Tereso Newcomer, PA-C   04/26/2015 2:28 PM

## 2015-04-26 NOTE — Telephone Encounter (Signed)
New message      Pt c/o Syncope: STAT if syncope occurred within 30 minutes and pt complains of lightheadedness High Priority if episode of passing out, completely, today or in last 24 hours   Did you pass out today?no When is the last time you passed out?  Almost passed out twice this week 1. Has this occurred multiple times? no  2. Did you have any symptoms prior to passing out? No. Bp medications was recently changed

## 2015-04-26 NOTE — Telephone Encounter (Signed)
I rtnd pt's call about pre-syncope feeling. Pt states he has not felt good since his med changes on 6/16 where aldactone was held x 2 days then resume at 12.5 mg daily; cozaar decreased to 50 mg daily. Pt states he did not pass out, though he fell on Monday and Tuesday of this week. Pt states he is staying hydrated with gatorade. I advised pt he really should be drinking gatorade since he has Heart Failure and gaotrade is loaded with salt, I advised pt that he should drink water to stay hydrated, pt agreeable to this. Pt denies any chest pain, sob, edema, n/v, fever, chills. Pt tells me that he just does not feel good since we changed his medications on 6/16. I advised I will d/w Bing Neighbors. PA though we have clinic right now so I will get back to him sometime later today. Pt said ok and thank you. Pt has appt 05/31/15 with Dr. Jens Som.

## 2015-04-27 MED ORDER — CARVEDILOL 25 MG PO TABS: 12.5000 mg | ORAL_TABLET | Freq: Two times a day (BID) | ORAL | Status: DC

## 2015-04-27 NOTE — Progress Notes (Signed)
Cardiology Office Note   Date:  04/28/2015   ID:  Todd Jackson, DOB 02-15-1971, MRN [id number]  PCP:  PROVIDER NOT IN SYSTEM [facility] Cardiologist:  Dr. Olga Millers   Electrophysiologist:  Dr. Lewayne Bunting   Chief Complaint  Patient presents with  . Follow-up    Orthostatic hypotension     History of Present Illness: Todd Jackson is a 44 y.o. male with a hx of prior inferior posterior myocardial infarction. Cardiac catheterization in 2000 showed an occluded right coronary artery and patient had PCI at that time. Ejection fraction was 50%. Previous monitor showed frequent PVCs. Carotid Dopplers in January 2010 showed less than 50% bilateral stenosis. Echo repeated 12/15 and showed EF 25%, moderate LAE and mild MR. Cardiac catheterization December 2015 showed no significant coronary disease and ejection fraction 25%. Had ICD placed February 2016. Last seen by Dr. Olga Millers 12/14/14.  Last seen by Dr. Lewayne Bunting 03/21/15.  ICD was functioning appropriately.  No VT documented.    I saw him on 04/20/15 after calling in with complaints of weakness, fatigue and chest pain related to an asthma exacerbation. He was treated at [facility] emergency room. He then called in with complaints of near syncope. He had significant orthostatic hypotension in the office (BP 123/72 >> 90/50 from lying to standing). I decreased his losartan and spironolactone. He called in recently with continued symptoms of lightheadedness with standing. I requested that his carvedilol be decreased to 12.5 mg twice a day. He is added on for further evaluation.  Since I last saw him, he has continued to struggle with orthostatic intolerance and near syncope. He denies frank syncope. He denies any shocks from his ICD. He denies fevers, chills, cough, melena, hematochezia, dysuria, hematuria, vomiting, diarrhea. He denies chest discomfort. He denies significant shortness of breath. He denies orthopnea, PND  or edema. Of note, he has recently lost 18 pounds. He has limited his diet. He was taken off of Seroquel. He has changed his eating habits.   Studies/Reports Reviewed Today:  Echo 10/04/14 - Septal , apical and inferior wall hypokinesis.EF 25%. - Mitral valve: There was mild regurgitation. - Left atrium: The atrium was moderately dilated. - Atrial septum: No defect or patent foramen ovale was identified. - Pulmonary arteries: PA peak pressure: 35 mm Hg (S).  LHC 10/13/14 LM:  Widely patent. LAD: widely patent. LCx:  There is only mild disease in the circumflex system. RCA:  The stent in the mid vessel is widely patent. There is mild disease in the distal RCA.  EF:  25%, inferior akinesis. LVEDP was 32 mmHg.  Myoview 11/17/13 High risk stress nuclear study with a large, severe intensity, fixed inferior defect consistent with prior inferior infarct; no ischemia; study high risk due to decreased LV function.  LV Ejection Fraction: 34%. LV Wall Motion: Inferior akinesis.  Carotid US 11/29/08 Bilateral ICA < 50%  Past Medical History  Diagnosis Date  . CAD (coronary artery disease)   . OSA (obstructive sleep apnea)   . Obesity   . Bipolar disorder   . Diverticulitis   . Ischemic cardiomyopathy     Past Surgical History  Procedure Laterality Date  . Right elbow surgery    . Left heart catheterization with coronary angiogram N/A 10/13/2014    Procedure: LEFT HEART CATHETERIZATION WITH CORONARY ANGIOGRAM;  Surgeon: Corky Crafts, MD;  Location: [facility] CATH LAB;  Service: Cardiovascular;  Laterality: N/A;  . Implantable cardioverter defibrillator implant N/A 12/09/2014  Procedure: IMPLANTABLE CARDIOVERTER DEFIBRILLATOR IMPLANT;  Surgeon: Marinus Maw, MD;  Location: [facility] CATH LAB;  Service: Cardiovascular;  Laterality: N/A;     Current Outpatient Prescriptions  Medication Sig Dispense Refill  . aspirin 325 MG tablet Take 325 mg by mouth daily.    . carvedilol (COREG) 25 MG  tablet Take 0.5 tablets (12.5 mg total) by mouth 2 (two) times daily with a meal. 30 tablet 11  . clonazePAM (KLONOPIN) 1 MG tablet Take 1 mg by mouth 3 (three) times daily.     . Folic Acid-Vit B6-Vit B12 (FOLBEE) 2.5-25-1 MG TABS tablet Take 1 tablet by mouth daily. 90 tablet 0  . furosemide (LASIX) 40 MG tablet Take 1 tablet (40 mg total) by mouth as needed. 30 tablet 11  . gabapentin (NEURONTIN) 300 MG capsule Take 300 mg by mouth 2 (two) times daily.    Marland Kitchen lamoTRIgine (LAMICTAL) 200 MG tablet Take 200 mg by mouth at bedtime.     . metFORMIN (GLUCOPHAGE) 1000 MG tablet Take 1 tablet (1,000 mg total) by mouth 2 (two) times daily. (Patient taking differently: Take 500 mg by mouth 2 (two) times daily. )  0  . modafinil (PROVIGIL) 200 MG tablet Take 400 mg by mouth daily as needed.     . montelukast (SINGULAIR) 10 MG tablet Take 10 mg by mouth daily.     . nitroGLYCERIN (NITROSTAT) 0.4 MG SL tablet Place 1 tablet (0.4 mg total) under the tongue every 5 (five) minutes as needed for chest pain. 25 tablet 11  . Oxcarbazepine (TRILEPTAL) 300 MG tablet Take 300-900 mg by mouth 2 (two) times daily. 1 tablet in the AM and 3 at night  0  . PROAIR HFA 108 (90 BASE) MCG/ACT inhaler Inhale 1-2 puffs into the lungs every 4 (four) hours as needed for wheezing or shortness of breath. Asthma  1  . simvastatin (ZOCOR) 80 MG tablet Take 1 tablet (80 mg total) by mouth daily. 30 tablet 11   No current facility-administered medications for this visit.    Allergies:   Review of patient's allergies indicates no known allergies.    Social History:  The patient  reports that he quit smoking about 16 years ago. His smoking use included Cigarettes. He started smoking about 34 years ago. He has a 18 pack-year smoking history. His smokeless tobacco use includes Snuff. He reports that he drinks alcohol.   Family History:  The patient's family history includes Alcohol abuse in his mother; CAD in his mother; Colon cancer in  his father; Diabetes in his father.    ROS:   Please see the history of present illness.   Review of Systems  Constitution: Positive for decreased appetite and malaise/fatigue.  Gastrointestinal: Positive for nausea.  Neurological: Positive for dizziness.  All other systems reviewed and are negative.     PHYSICAL EXAM: VS:  BP 78/60 mmHg  Pulse 91  Ht 6\' 1"  (1.854 m)  Wt 280 lb (127.007 kg)  BMI 36.95 kg/m2    Lying: BP 84/68, HR 60 Sitting: BP 84/68, HR 68 Standing: BP 78/50, HR 94   Wt Readings from Last 3 Encounters:  04/28/15 280 lb (127.007 kg)  04/20/15 283 lb (128.368 kg)  03/21/15 298 lb 6.4 oz (135.353 kg)     GEN: Well nourished, well developed, in no acute distress HEENT: normal Neck: no JVD,  no masses Cardiac:  Normal S1/S2, RRR; no murmur, no rubs or gallops, no edema    Respiratory:  clear to auscultation bilaterally, no wheezing, rhonchi or rales. GI: soft, nontender, nondistended, + BS MS: no deformity or atrophy Skin: warm and dry  Neuro:  CNs II-XII intact, Strength and sensation are intact Psych: Normal affect   EKG:  EKG is not ordered today.  It demonstrates:   N/A   Recent Labs: 12/14/2014: ALT 39 04/20/2015: BUN 16; Creatinine, Ser 1.02; Hemoglobin 13.6; Platelets 263.0; Potassium 4.2; Sodium 137; TSH 2.59    Lipid Panel    Component Value Date/Time   CHOL 108 12/14/2014 1003   TRIG 126 12/14/2014 1003   HDL 27* 12/14/2014 1003   CHOLHDL 4.0 12/14/2014 1003   VLDL 25 12/14/2014 1003   LDLCALC 56 12/14/2014 1003      ASSESSMENT AND PLAN:  Orthostatic Hypotension:  His blood pressure did not drop significantly from lying to standing today. However, he is hypotensive with blood pressures in the 80s over 60s. Heart rate increased from 60 >> 94 from lying to standing. He denies chest pain or shortness of breath. He denies any infectious symptoms. Previous lab work was unremarkable. He is urinating normally. I suspect that his recent  change in diet, elimination of salt and discontinuation of Seroquel leading to weight loss has contributed to his current symptoms. He likely does not need the dose of medications he currently takes. I will stop his losartan and spironolactone. I have given him instructions to hold his Coreg if his blood pressure is less than 120. Check a follow-up BMET today. Push fluids today. Add extra salt today. He should call over the weekend if his symptoms worsen. Follow-up Monday for recheck.    Chronic systolic heart failure:  Volume stable. He is NYHA 2. He will monitor his weights over the next several days to ensure he is not developing volume excess.   Cardiomyopathy, ischemic:   Continue beta-blocker.  For now, hold  angiotensin receptor blocker, spironolactone.  Coronary artery disease involving native coronary artery of native heart without angina pectoris:  Patent stent in RCA in 10/2014.   No exertional angina.  Continue ASA, statin, beta-blocker.  S/P ICD (internal cardiac defibrillator) procedure:  FU with EP as planned.   Essential hypertension:  As noted, blood pressure medications will be adjusted for hypotension.   Hyperlipidemia :  Continue statin.     Medication Changes: Current medicines are reviewed at length with the patient today.  Concerns regarding medicines are as outlined above.  The following changes have been made:   Discontinued Medications   LOSARTAN (COZAAR) 50 MG TABLET    Take 1 tablet (50 mg total) by mouth daily.   SPIRONOLACTONE (ALDACTONE) 25 MG TABLET    Take 0.5 tablets (12.5 mg total) by mouth daily.   Modified Medications   No medications on file   New Prescriptions   No medications on file     Labs/ tests ordered today include:   Orders Placed This Encounter  Procedures  . Basic Metabolic Panel (BMET)     Disposition:   FU in the flex clinic on Monday and  with Dr. Olga Millers next month as planned.    Signed, Brynda Rim,  MHS 04/28/2015 1:33 PM    [facility] [address] [place], [place], [place]  [address] Phone: [phone]; Fax: [phone]

## 2015-04-27 NOTE — Telephone Encounter (Signed)
Decrease Coreg to 12.5 mg Twice daily  Arrange FU in next 1-2 weeks with me. Will need orthostatic vital signs repeated at that visit. Have him get compression stockings (over the counter) and wear from waking to bedtime.  Keep FU with Dr. Olga Millers in July as planned. Tereso Newcomer, PA-C   04/27/2015 3:48 PM

## 2015-04-27 NOTE — Telephone Encounter (Signed)
Called patient with Natale Lay note as below. Patient feels that he needs to see Tereso Newcomer sooner than later, because of work. Patient is to return to work on Saturday and does not want to have an syncopal episode and fall at work. Patient is on the schedule tomorrow. Patient verbalized understanding.

## 2015-04-27 NOTE — Telephone Encounter (Signed)
Called patient back. Patient states that he does feel a little better with the medication changes, but still is having near syncopal episodes. Patient feels that his BP is still low. Asked patient to check BP. Patient has no way of checking. Informed patient that it would be a good idea to have his BP check regularly and he might should see about getting a BP machine. Patient stated he is suppose to go back to work on Saturday, but he can't see how he can go feeling like this. Will forward to [facility] PA for advisement.

## 2015-04-27 NOTE — Telephone Encounter (Signed)
Follow up      Pt states his bp dropped again today and he almost passed out.  Todd Jackson was supposed to have called him yesterday but there was no call.  Please call and let pt know what he is to do

## 2015-04-28 ENCOUNTER — Encounter: Payer: Self-pay | Admitting: Physician Assistant

## 2015-04-28 ENCOUNTER — Telehealth: Payer: Self-pay | Admitting: *Deleted

## 2015-04-28 ENCOUNTER — Ambulatory Visit (INDEPENDENT_AMBULATORY_CARE_PROVIDER_SITE_OTHER): Payer: BLUE CROSS/BLUE SHIELD | Admitting: Physician Assistant

## 2015-04-28 VITALS — BP 78/60 | HR 91 | Ht 73.0 in | Wt 280.0 lb

## 2015-04-28 DIAGNOSIS — I2510 Atherosclerotic heart disease of native coronary artery without angina pectoris: Secondary | ICD-10-CM

## 2015-04-28 DIAGNOSIS — E785 Hyperlipidemia, unspecified: Secondary | ICD-10-CM

## 2015-04-28 DIAGNOSIS — I255 Ischemic cardiomyopathy: Secondary | ICD-10-CM | POA: Diagnosis not present

## 2015-04-28 DIAGNOSIS — I5022 Chronic systolic (congestive) heart failure: Secondary | ICD-10-CM | POA: Diagnosis not present

## 2015-04-28 DIAGNOSIS — Z95810 Presence of automatic (implantable) cardiac defibrillator: Secondary | ICD-10-CM

## 2015-04-28 DIAGNOSIS — I10 Essential (primary) hypertension: Secondary | ICD-10-CM

## 2015-04-28 DIAGNOSIS — I951 Orthostatic hypotension: Secondary | ICD-10-CM

## 2015-04-28 LAB — BASIC METABOLIC PANEL
BUN: 18 mg/dL (ref 6–23)
CO2: 28 mEq/L (ref 19–32)
Calcium: 10.3 mg/dL (ref 8.4–10.5)
Chloride: 98 mEq/L (ref 96–112)
Creatinine, Ser: 1.7 mg/dL — ABNORMAL HIGH (ref 0.40–1.50)
GFR: 46.32 mL/min — AB (ref >60.00)
Glucose, Bld: 94 mg/dL (ref 70–99)
Potassium: 4.7 mEq/L (ref 3.5–5.1)
SODIUM: 136 meq/L (ref 135–145)

## 2015-04-28 NOTE — Telephone Encounter (Signed)
lmptcb to go over lab results; per Ronie Spies, PA who reviewed labs advised no lasix over the weekend and no NSAID's over the weekend w/repeat bmet 6/27.  I advised Todd Jackson pt has appt w/ Dawayne Patricia. NP on FLEX schedule.

## 2015-04-28 NOTE — Patient Instructions (Signed)
Medication Instructions:  1. STOP SPIRONOLACTONE  2. STOP LOSARTAN  Labwork: TODAY BMET  Testing/Procedures: NONE  Follow-Up: 05/01/15 @ 3 PM WITH Norma Fredrickson, NP  Any Other Special Instructions Will Be Listed Below (If Applicable). 1. MONITOR BP DAILY BEFORE TAKING EACH DOSE OF COREG; IF THE SYSTOLIC BP IS BELOW 120 THEN DO NOT TAKE COREG 2. EXTRA SALT TODAY ONLY 3. PUSH FLUIDS (WATER) 4. WEIGH DAILY AND CALL IF WEIGHT IS UP 3 LB'S IN 1 DAY OR 5 LB'S IN 1 WEEK; [phone]

## 2015-04-28 NOTE — Telephone Encounter (Signed)
ptcb and has been notified of lab results by phone and verbalized understanding to recommendations. Pt states he also takes voltaren and can this affect his kidney. I advised yes it can and I did not see voltaren on his med list. pt will hold voltaren for the weekend as well. Pt has appt 05/01/15 w/Lori G. NP and we will get repeat bmet 6/27. Pt agreeable to plan of care.

## 2015-05-01 ENCOUNTER — Encounter: Payer: Self-pay | Admitting: Nurse Practitioner

## 2015-05-01 ENCOUNTER — Ambulatory Visit (INDEPENDENT_AMBULATORY_CARE_PROVIDER_SITE_OTHER): Payer: BLUE CROSS/BLUE SHIELD | Admitting: Nurse Practitioner

## 2015-05-01 VITALS — BP 114/77 | HR 79 | Ht 73.0 in | Wt 282.0 lb

## 2015-05-01 DIAGNOSIS — I5022 Chronic systolic (congestive) heart failure: Secondary | ICD-10-CM

## 2015-05-01 DIAGNOSIS — I951 Orthostatic hypotension: Secondary | ICD-10-CM

## 2015-05-01 DIAGNOSIS — I255 Ischemic cardiomyopathy: Secondary | ICD-10-CM

## 2015-05-01 NOTE — Patient Instructions (Addendum)
We will be checking the following labs today - BMET & BNP   Medication Instructions:    Continue with your current medicines.   Half dose of the Coreg (12.5) for now twice a day    Testing/Procedures To Be Arranged:  Echocardiogram this week.   Follow-Up:   Will refer you to the Heart Failure Clinic - to Dr. Nicholes Mangoan Bensimhon on July 25th at 11:40 am.     Other Special Instructions:   Monitor your blood pressure at home - sitting and standing. Call us if top number staying above 130.   Out of work until seen in the CHF clinic  Call the [facility] office at [phone] if you have any questions, problems or concerns.

## 2015-05-01 NOTE — Progress Notes (Signed)
CARDIOLOGY OFFICE NOTE  Date:  05/01/2015    Todd Jackson Date of Birth: 1971-03-10 Medical Record #[id number]  PCP:  PROVIDER NOT IN SYSTEM  Cardiologist:  Jens Som and Ladona Ridgel    Chief Complaint  Patient presents with  . Follow up visit for orthostasis    Seen for Dr. Jens Som and Ladona Ridgel    History of Present Illness: Todd Jackson is a 44 y.o. male who presents today for a 3 day check. Seen for Dr. Jens Som and Dr. Ladona Ridgel. He has a hx of prior inferior posterior myocardial infarction back in 2000. Cardiac catheterization in 2000 showed an occluded right coronary artery and patient had PCI at that time. Ejection fraction was 50%. Previous monitor showed frequent PVCs. Carotid Dopplers in January 2010 showed less than 50% bilateral stenosis. Echo repeated 12/15 and showed EF 25%, moderate LAE and mild MR. Cardiac catheterization December 2015 showed no significant coronary disease and ejection fraction 25%. Had ICD placed February 2016. Last seen by Dr. Olga Millers 12/14/14. Last seen by Dr. Lewayne Bunting 03/21/15. ICD was functioning appropriately. No VT documented.   Seen by Tereso Newcomer, PA on 04/20/15 after calling in with complaints of weakness, fatigue and chest pain related to an asthma exacerbation. He was treated at [facility] emergency room. He then called in with complaints of near syncope. He had significant orthostatic hypotension in the office (BP 123/72 >> 90/50 from lying to standing). His losartan and spironolactone were decreased. He called in with continued symptoms of lightheadedness with standing.Carvedilol was decreased to 12.5 mg twice a day.   Seen back on Friday again by [facility]. He has continued to struggle with orthostatic intolerance and near syncope. He has been losing weight intentionally (317 to 282) and has changed his diet. He previously had probably over 4gm of sodium a day.   Comes back today. Here with his wife. He has been a little better over the past  weekend. But today he is weak and nauseated. Has a headache. No fever or chills. No cough. Weight has been stable. No shocks. Breathing is ok. He noted his BP going up to over 140 and he put himself back on Coreg 25 mg last night and again this AM. He had been on ARB and aldactone for many years. His sodium content is probably around 2500 mg now. 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 He remains fatigued. Has felt like he was going to pass out at work and is currently out of work until this Saturday. His wife is on his insurance. He has not pursued disability due to the issue of health insurance - wife is unemployed. He noes that all this orthostasis started after being treated for 3 days with Lasix - he typically does not have to use his lasix.   Past Medical History  Diagnosis Date  . CAD (coronary artery disease)   . OSA (obstructive sleep apnea)   . Obesity   . Bipolar disorder   . Diverticulitis   . Ischemic cardiomyopathy     Past Surgical History  Procedure Laterality Date  . Right elbow surgery    . Left heart catheterization with coronary angiogram N/A 10/13/2014    Procedure: LEFT HEART CATHETERIZATION WITH CORONARY ANGIOGRAM;  Surgeon: Corky Crafts, MD;  Location: [facility] CATH LAB;  Service: Cardiovascular;  Laterality: N/A;  . Implantable cardioverter defibrillator implant N/A 12/09/2014  Procedure: IMPLANTABLE CARDIOVERTER DEFIBRILLATOR IMPLANT;  Surgeon: Marinus Maw, MD;  Location: [facility] CATH LAB;  Service: Cardiovascular;  Laterality: N/A;     Medications: Current Outpatient Prescriptions  Medication Sig Dispense Refill  . aspirin 325 MG tablet Take 325 mg by mouth daily.    . carvedilol (COREG) 25 MG tablet Take 0.5 tablets (12.5 mg total) by mouth 2 (two) times daily with a meal. 30 tablet 11  . clonazePAM (KLONOPIN) 1 MG tablet Take 1 mg by mouth 3 (three) times daily.     . furosemide  (LASIX) 40 MG tablet Take 1 tablet (40 mg total) by mouth as needed. 30 tablet 11  . gabapentin (NEURONTIN) 300 MG capsule Take 300 mg by mouth 2 (two) times daily.    Marland Kitchen lamoTRIgine (LAMICTAL) 200 MG tablet Take 200 mg by mouth at bedtime.     . metFORMIN (GLUCOPHAGE) 1000 MG tablet Take 1 tablet (1,000 mg total) by mouth 2 (two) times daily. (Patient taking differently: Take 500 mg by mouth 2 (two) times daily. )  0  . modafinil (PROVIGIL) 200 MG tablet Take 400 mg by mouth daily as needed.     . montelukast (SINGULAIR) 10 MG tablet Take 10 mg by mouth daily.     . nitroGLYCERIN (NITROSTAT) 0.4 MG SL tablet Place 1 tablet (0.4 mg total) under the tongue every 5 (five) minutes as needed for chest pain. 25 tablet 11  . Oxcarbazepine (TRILEPTAL) 300 MG tablet Take 300-900 mg by mouth 2 (two) times daily. 1 tablet in the AM and 3 at night  0  . PROAIR HFA 108 (90 BASE) MCG/ACT inhaler Inhale 1-2 puffs into the lungs every 4 (four) hours as needed for wheezing or shortness of breath. Asthma  1  . simvastatin (ZOCOR) 80 MG tablet Take 1 tablet (80 mg total) by mouth daily. 30 tablet 11   No current facility-administered medications for this visit.    Allergies: No Known Allergies  Social History: The patient  reports that he quit smoking about 16 years ago. His smoking use included Cigarettes. He started smoking about 34 years ago. He has a 18 pack-year smoking history. His smokeless tobacco use includes Snuff. He reports that he drinks alcohol.   Family History: The patient's family history includes Alcohol abuse in his mother; CAD in his mother; Colon cancer in his father; Diabetes in his father.   Review of Systems: Please see the history of present illness.   Otherwise, the review of systems is positive for none.   All other systems are reviewed and negative.   Physical Exam: VS:  BP 114/77 mmHg  Pulse 79  Ht 6\' 1"  (1.854 m)  Wt 282 lb (127.914 kg)  BMI 37.21 kg/m2 .  BMI Body mass  index is 37.21 kg/(m^2).  Wt Readings from Last 3 Encounters:  05/01/15 282 lb (127.914 kg)  04/28/15 280 lb (127.007 kg)  04/20/15 283 lb (128.368 kg)   Lying BP is 114/77 with HR 79 Sitting BP is 118/76 with HR 77 Standing BP 103/66 with HR 86  Standing BP 2 minutes later 99/70 with HR 86.   General: Pleasant. He looks chronically ill but in no acute distress. His color is sallow. HEENT: Normal but has poor dentition Neck: Supple, no JVD, carotid bruits, or masses noted.  Cardiac: Regular rate and rhythm. No murmurs, rubs, or gallops. No edema.  Respiratory:  Lungs are clear to auscultation bilaterally with normal work of breathing.  GI: Soft  and nontender.  MS: No deformity or atrophy. Gait and ROM intact. Skin: Warm and dry. Color is sallow.  Neuro:  Strength and sensation are intact and no gross focal deficits noted.  Psych: Alert, appropriate and with normal affect.   LABORATORY DATA:  EKG:  EKG is not ordered today.  Lab Results  Component Value Date   WBC 7.3 04/20/2015   HGB 13.6 04/20/2015   HCT 40.4 04/20/2015   PLT 263.0 04/20/2015   GLUCOSE 94 04/28/2015   CHOL 108 12/14/2014   TRIG 126 12/14/2014   HDL 27* 12/14/2014   LDLCALC 56 12/14/2014   ALT 39 12/14/2014   AST 19 12/14/2014   NA 136 04/28/2015   K 4.7 04/28/2015   CL 98 04/28/2015   CREATININE 1.70* 04/28/2015   BUN 18 04/28/2015   CO2 28 04/28/2015   TSH 2.59 04/20/2015   INR 0.97 10/12/2014    BNP (last 3 results) No results for input(s): BNP in the last 8760 hours.  ProBNP (last 3 results) No results for input(s): PROBNP in the last 8760 hours.   Other Studies Reviewed Today:  Echo 10/04/14 - Septal , apical and inferior wall hypokinesis.EF 25%. - Mitral valve: There was mild regurgitation. - Left atrium: The atrium was moderately dilated. - Atrial septum: No defect or patent foramen ovale was identified. - Pulmonary arteries: PA peak pressure: 35 mm Hg (S).  LHC  10/13/14 LM: Widely patent. LAD: widely patent. LCx: There is only mild disease in the circumflex system. RCA: The stent in the mid vessel is widely patent. There is mild disease in the distal RCA.  EF: 25%, inferior akinesis. LVEDP was 32 mmHg.  Myoview 11/17/13 High risk stress nuclear study with a large, severe intensity, fixed inferior defect consistent with prior inferior infarct; no ischemia; study high risk due to decreased LV function. LV Ejection Fraction: 34%. LV Wall Motion: Inferior akinesis.  Carotid US 11/29/08 Bilateral ICA < 50%   Assessment/Plan: Orthostatic Hypotension: Improved but no longer on ideal CHF regimen.   Chronic systolic heart failure: Volume stable by weight but I wonder if his HF is progressing. ?low output syndrome. I think he would benefit from CHF clinic referral for advanced therapy. He has previously been on a good CHF regimen for many years - not clear what has changed but I get the feeling that something has changed. Will refer to Dr. Gala RomneyBensimhon. Repeat his echo. I have placed him out of work until that consult. He may need to consider long term disability. Health insurance is his biggest concern for himself and his wife.   Cardiomyopathy, ischemic: Continue beta-blocker. He will go back to just 12.5 mg BID for now. For now, hold angiotensin receptor blocker, spironolactone. Referring to CHF clinic.   Coronary artery disease involving native coronary artery of native heart without angina pectoris: Patent stent in RCA in 10/2014. No exertional angina. Continue ASA, statin, beta-blocker.  S/P ICD (internal cardiac defibrillator) procedure: FU with EP as planned.   Essential hypertension: As noted, blood pressure medications have been adjusted for hypotension.   Hyperlipidemia : Continue statin.   Current medicines are reviewed with the patient today.  The patient does not have concerns regarding medicines other than what has been  noted above.  The following changes have been made:  See above.  Labs/ tests ordered today include:    Orders Placed This Encounter  Procedures  . Brain natriuretic peptide  . Basic metabolic panel  . ECHOCARDIOGRAM COMPLETE  Disposition:   FU with Dr. Jens Som as planned. Referring to CHF clinic.   Patient is agreeable to this plan and will call if any problems develop in the interim.   Signed: Rosalio Macadamia, RN, ANP-C 05/01/2015 4:22 PM  [facility] [address] [place], [place]  [address] Phone: [phone] Fax: [phone]

## 2015-05-02 LAB — BASIC METABOLIC PANEL
BUN: 17 mg/dL (ref 6–23)
CO2: 23 mEq/L (ref 19–32)
Calcium: 10.1 mg/dL (ref 8.4–10.5)
Chloride: 99 mEq/L (ref 96–112)
Creatinine, Ser: 1.06 mg/dL (ref 0.40–1.50)
GFR: 79.88 mL/min (ref >60.00)
Glucose, Bld: 87 mg/dL (ref 70–99)
Potassium: 4.9 mEq/L (ref 3.5–5.1)
Sodium: 135 mEq/L (ref 135–145)

## 2015-05-02 LAB — BRAIN NATRIURETIC PEPTIDE: Pro B Natriuretic peptide (BNP): 49 pg/mL (ref 0.0–100.0)

## 2015-05-03 ENCOUNTER — Telehealth: Payer: Self-pay | Admitting: Nurse Practitioner

## 2015-05-03 NOTE — Telephone Encounter (Signed)
Follow Up       Pt returning Pam P's phone call.

## 2015-05-03 NOTE — Telephone Encounter (Signed)
Called patient about lab results. Per Norma FredricksonLori Gerhardt NP, his labs are okay. Will see how the echo turns out later this week. Patient verbalized understanding and confirmed appointment for tomorrow.

## 2015-05-04 ENCOUNTER — Other Ambulatory Visit: Payer: Self-pay

## 2015-05-04 ENCOUNTER — Ambulatory Visit (HOSPITAL_COMMUNITY): Payer: BLUE CROSS/BLUE SHIELD | Attending: Cardiology

## 2015-05-04 DIAGNOSIS — I951 Orthostatic hypotension: Secondary | ICD-10-CM

## 2015-05-04 DIAGNOSIS — I429 Cardiomyopathy, unspecified: Secondary | ICD-10-CM | POA: Diagnosis present

## 2015-05-04 DIAGNOSIS — I313 Pericardial effusion (noninflammatory): Secondary | ICD-10-CM | POA: Insufficient documentation

## 2015-05-04 DIAGNOSIS — I255 Ischemic cardiomyopathy: Secondary | ICD-10-CM | POA: Diagnosis not present

## 2015-05-04 DIAGNOSIS — I517 Cardiomegaly: Secondary | ICD-10-CM | POA: Insufficient documentation

## 2015-05-04 DIAGNOSIS — I5022 Chronic systolic (congestive) heart failure: Secondary | ICD-10-CM | POA: Diagnosis not present

## 2015-05-10 ENCOUNTER — Telehealth: Payer: Self-pay | Admitting: Cardiology

## 2015-05-10 NOTE — Telephone Encounter (Signed)
We had discussed him being out of work until his visit in the CHF clinic.

## 2015-05-10 NOTE — Telephone Encounter (Signed)
Spoke to this patient at length regarding medications, BP issues, weight gain.  Mainly, he has had some BP fluctuations, did have low BP this AM on standing (76/60). He was symptomatic w/ this - dizzy, this resolved quickly. He has episodes like this routinely. He notes BP of 156/60 checked sitting last night. Notes HR changes from sitting to standing that his wife has recorded, 62-> 89.  Noted known concerns/history of orthostatic hypotension. Was recently d/c'ed from losartan due to concerns for BP dropping. Current carvedilol dose is 12.5mg  BID   He reports SOB on walking also. States last use of furosemide ~1 month ago. He knows to take this if/when he notes fluid/weight gain.  Reminded him SOB can be symptom of increased fluid - if difficult to track weight changes daily, treat symptoms - encouraged him to use furosemide more routinely. Advised to take daily next 2-3 days to see if symptoms improve.   Patient also wanted clarification on return to work. He saw Lawson FiscalLori in office recently. Needs clarification so that he can make sure insurer for short term disability has up-to-date information. Informed him I would route to provider, but also to request short term disability insurer to contact our office. He voiced understanding.

## 2015-05-10 NOTE — Telephone Encounter (Signed)
Spoke to patient at length again regarding issues - he mainly reiterated concerns. i did advise to call if new concerns, BP issues/SOB not resolved w/ lasix use. He voiced understanding.

## 2015-05-10 NOTE — Telephone Encounter (Signed)
Pt c/o medication issue:  1. Name of Medication: Carvedolol  2. How are you currently taking this medication (dosage and times per day)? Pt states that he was taken off of losartan and his lowered carvedilol to 12.5 Mg twice a day.  3. Are you having a reaction (difficulty breathing--STAT)? Yes, difficulty when he walks long and short distances. Wife states he " looks like a ghost when it happens"   4. What is your medication issue?   5. Comments: Pt states that he passed out at work recently and was seen by PA Tereso NewcomerScott Weaver whom the pt states  referred him to the CHF clinic, however pt states that he may need to speak with Dr. Jens Somrenshaw first to discuss everything before his appt at the CHF clinic    Pt also states that he is on short term disability and was placed on leave by Lawson FiscalLori until the 26th of July, Pt states that someone got in touch with PA Tereso NewcomerScott Weaver whom informed the rep that  The pt would only need disability leave until the 10th of July. Pt states that he will need an appt tp update the leave or have that straightened out for his short term disability. Please assist

## 2015-05-11 ENCOUNTER — Other Ambulatory Visit: Payer: Self-pay | Admitting: Cardiology

## 2015-05-11 ENCOUNTER — Telehealth: Payer: Self-pay | Admitting: *Deleted

## 2015-05-11 DIAGNOSIS — I5022 Chronic systolic (congestive) heart failure: Secondary | ICD-10-CM

## 2015-05-11 NOTE — Progress Notes (Signed)
Contacted by [facility] ER and pt presented with orthostatic symptoms; CR increased to 3; ER physician felt pt dry; lasix DCed and NSAID Dced; pt felt to be stable for DC per ER physician; will arrange repeat BMET tomorrow and Monday and PAOV Monday or Tuesday; has CHF appt 7/25. Olga MillersBrian Arianah Torgeson

## 2015-05-11 NOTE — Telephone Encounter (Signed)
Spoke to patient. Instruction given in regards to BMET  On 05/12/15 and 05/15/15 Appointment with Lawson FiscalLori on 05/15/15 at 11am Patient verbalized understanding.

## 2015-05-11 NOTE — Telephone Encounter (Signed)
SPOKE WITH DR Jens SomRENSHAW PATIENT  [facility] PER DR CRENSHAW MEDICATION CHANGE PATIENT NEED TO HAVE BMET ON TOMORROW 05/12/15 and Monday 05/15/15 at [address] lab.  patient needs an appointment with an extender Monday or Tuesday of next week.   RN will need to notify patient of orders and appointment. [facility]

## 2015-05-12 ENCOUNTER — Telehealth: Payer: Self-pay | Admitting: Physician Assistant

## 2015-05-12 ENCOUNTER — Other Ambulatory Visit (INDEPENDENT_AMBULATORY_CARE_PROVIDER_SITE_OTHER): Payer: BLUE CROSS/BLUE SHIELD | Admitting: *Deleted

## 2015-05-12 ENCOUNTER — Encounter (HOSPITAL_COMMUNITY): Payer: Self-pay | Admitting: *Deleted

## 2015-05-12 ENCOUNTER — Telehealth: Payer: Self-pay

## 2015-05-12 ENCOUNTER — Inpatient Hospital Stay (HOSPITAL_COMMUNITY)
Admission: EM | Admit: 2015-05-12 | Discharge: 2015-05-17 | DRG: 683 | Disposition: A | Discharge status: Home / Self Care | Payer: BLUE CROSS/BLUE SHIELD | Attending: Cardiology | Admitting: Cardiology

## 2015-05-12 ENCOUNTER — Emergency Department (HOSPITAL_COMMUNITY): Payer: BLUE CROSS/BLUE SHIELD

## 2015-05-12 DIAGNOSIS — E669 Obesity, unspecified: Secondary | ICD-10-CM | POA: Diagnosis present

## 2015-05-12 DIAGNOSIS — Z87891 Personal history of nicotine dependence: Secondary | ICD-10-CM

## 2015-05-12 DIAGNOSIS — F319 Bipolar disorder, unspecified: Secondary | ICD-10-CM | POA: Diagnosis present

## 2015-05-12 DIAGNOSIS — N289 Disorder of kidney and ureter, unspecified: Secondary | ICD-10-CM

## 2015-05-12 DIAGNOSIS — I252 Old myocardial infarction: Secondary | ICD-10-CM | POA: Diagnosis not present

## 2015-05-12 DIAGNOSIS — I429 Cardiomyopathy, unspecified: Secondary | ICD-10-CM

## 2015-05-12 DIAGNOSIS — E785 Hyperlipidemia, unspecified: Secondary | ICD-10-CM

## 2015-05-12 DIAGNOSIS — I5022 Chronic systolic (congestive) heart failure: Secondary | ICD-10-CM

## 2015-05-12 DIAGNOSIS — E119 Type 2 diabetes mellitus without complications: Secondary | ICD-10-CM | POA: Diagnosis present

## 2015-05-12 DIAGNOSIS — I2583 Coronary atherosclerosis due to lipid rich plaque: Secondary | ICD-10-CM

## 2015-05-12 DIAGNOSIS — N179 Acute kidney failure, unspecified: Secondary | ICD-10-CM

## 2015-05-12 DIAGNOSIS — Z955 Presence of coronary angioplasty implant and graft: Secondary | ICD-10-CM

## 2015-05-12 DIAGNOSIS — N19 Unspecified kidney failure: Secondary | ICD-10-CM

## 2015-05-12 DIAGNOSIS — Z79899 Other long term (current) drug therapy: Secondary | ICD-10-CM

## 2015-05-12 DIAGNOSIS — Z95810 Presence of automatic (implantable) cardiac defibrillator: Secondary | ICD-10-CM | POA: Diagnosis not present

## 2015-05-12 DIAGNOSIS — G4733 Obstructive sleep apnea (adult) (pediatric): Secondary | ICD-10-CM | POA: Diagnosis present

## 2015-05-12 DIAGNOSIS — I2510 Atherosclerotic heart disease of native coronary artery without angina pectoris: Secondary | ICD-10-CM | POA: Diagnosis present

## 2015-05-12 DIAGNOSIS — I5023 Acute on chronic systolic (congestive) heart failure: Secondary | ICD-10-CM | POA: Diagnosis not present

## 2015-05-12 DIAGNOSIS — E860 Dehydration: Secondary | ICD-10-CM | POA: Diagnosis present

## 2015-05-12 DIAGNOSIS — F3178 Bipolar disorder, in full remission, most recent episode mixed: Secondary | ICD-10-CM | POA: Diagnosis not present

## 2015-05-12 DIAGNOSIS — I255 Ischemic cardiomyopathy: Secondary | ICD-10-CM | POA: Diagnosis present

## 2015-05-12 DIAGNOSIS — Z7982 Long term (current) use of aspirin: Secondary | ICD-10-CM

## 2015-05-12 DIAGNOSIS — I10 Essential (primary) hypertension: Secondary | ICD-10-CM | POA: Diagnosis present

## 2015-05-12 DIAGNOSIS — Z9861 Coronary angioplasty status: Secondary | ICD-10-CM

## 2015-05-12 HISTORY — DX: Presence of automatic (implantable) cardiac defibrillator: Z95.810

## 2015-05-12 HISTORY — DX: Acute kidney failure, unspecified: N17.9

## 2015-05-12 HISTORY — DX: Peripheral vascular disease, unspecified: I73.9

## 2015-05-12 HISTORY — DX: ST elevation (STEMI) myocardial infarction involving other coronary artery of inferior wall: I21.19

## 2015-05-12 HISTORY — DX: Disorder of arteries and arterioles, unspecified: I77.9

## 2015-05-12 LAB — BASIC METABOLIC PANEL
ANION GAP: 12 (ref 5–15)
BUN: 26 mg/dL — ABNORMAL HIGH (ref 6–23)
BUN: 27 mg/dL — ABNORMAL HIGH (ref 6–20)
CALCIUM: 9.4 mg/dL (ref 8.4–10.5)
CO2: 24 meq/L (ref 19–32)
CO2: 20 mmol/L — ABNORMAL LOW (ref 22–32)
CREATININE: 4.54 mg/dL — AB (ref 0.40–1.50)
CREATININE: 4.79 mg/dL — AB (ref 0.61–1.24)
Calcium: 9 mg/dL (ref 8.9–10.3)
Chloride: 100 mEq/L (ref 96–112)
Chloride: 103 mmol/L (ref 101–111)
GFR calc non Af Amer: 13 mL/min — ABNORMAL LOW (ref >60)
GFR, EST AFRICAN AMERICAN: 15 mL/min — AB (ref >60)
GFR: 14.91 mL/min — CL (ref >60.00)
GLUCOSE: 116 mg/dL — AB (ref 70–99)
GLUCOSE: 81 mg/dL (ref 65–99)
Potassium: 3.7 mEq/L (ref 3.5–5.1)
Potassium: 4.1 mmol/L (ref 3.5–5.1)
Sodium: 137 mEq/L (ref 135–145)
Sodium: 135 mmol/L (ref 135–145)

## 2015-05-12 LAB — GLUCOSE, CAPILLARY: Glucose-Capillary: 85 mg/dL (ref 65–99)

## 2015-05-12 LAB — I-STAT TROPONIN, ED: Troponin i, poc: 0.01 ng/mL (ref 0.00–0.08)

## 2015-05-12 LAB — CBC
HCT: 33.2 % — ABNORMAL LOW (ref 39.0–52.0)
HEMOGLOBIN: 11.5 g/dL — AB (ref 13.0–17.0)
MCH: 31 pg (ref 26.0–34.0)
MCHC: 34.6 g/dL (ref 30.0–36.0)
MCV: 89.5 fL (ref 78.0–100.0)
Platelets: 199 10*3/uL (ref 150–400)
RBC: 3.71 MIL/uL — AB (ref 4.22–5.81)
RDW: 14.9 % (ref 11.5–15.5)
WBC: 9.2 10*3/uL (ref 4.0–10.5)

## 2015-05-12 LAB — CBG MONITORING, ED
Glucose-Capillary: 67 mg/dL (ref 65–99)
Glucose-Capillary: 104 mg/dL — ABNORMAL HIGH (ref 65–99)

## 2015-05-12 MED ORDER — LEVOCETIRIZINE DIHYDROCHLORIDE 5 MG PO TABS: 10.0000 mg | ORAL_TABLET | Freq: Every evening | ORAL | Status: DC

## 2015-05-12 MED ORDER — ASPIRIN EC 81 MG PO TBEC
81.0000 mg | DELAYED_RELEASE_TABLET | Freq: Every day | ORAL | Status: DC
  Administered 2015-05-13 – 2015-05-17 (×5): 81 mg via ORAL
  Filled 2015-05-12 (×5): qty 1

## 2015-05-12 MED ORDER — INSULIN ASPART 100 UNIT/ML ~~LOC~~ SOLN: 0.0000 [IU] | Freq: Every day | SUBCUTANEOUS | Status: DC

## 2015-05-12 MED ORDER — CARVEDILOL 12.5 MG PO TABS
12.5000 mg | ORAL_TABLET | Freq: Two times a day (BID) | ORAL | Status: DC
  Administered 2015-05-12 – 2015-05-13 (×3): 12.5 mg via ORAL
  Filled 2015-05-12 (×5): qty 1

## 2015-05-12 MED ORDER — OXCARBAZEPINE 300 MG PO TABS
900.0000 mg | ORAL_TABLET | Freq: Every day | ORAL | Status: DC
  Administered 2015-05-12 – 2015-05-16 (×5): 900 mg via ORAL
  Filled 2015-05-12 (×6): qty 3

## 2015-05-12 MED ORDER — SODIUM CHLORIDE 0.9 % IV SOLN
INTRAVENOUS | Status: AC
  Administered 2015-05-12: 18:00:00 via INTRAVENOUS

## 2015-05-12 MED ORDER — ALBUTEROL SULFATE (2.5 MG/3ML) 0.083% IN NEBU
2.5000 mg | INHALATION_SOLUTION | RESPIRATORY_TRACT | Status: DC | PRN
Start: 2015-05-12 — End: 2015-05-17
  Administered 2015-05-15 – 2015-05-16 (×3): 2.5 mg via RESPIRATORY_TRACT
  Filled 2015-05-12 (×3): qty 3

## 2015-05-12 MED ORDER — ACETAMINOPHEN 325 MG PO TABS: 650.0000 mg | ORAL_TABLET | ORAL | Status: DC | PRN

## 2015-05-12 MED ORDER — GABAPENTIN 300 MG PO CAPS
300.0000 mg | ORAL_CAPSULE | Freq: Two times a day (BID) | ORAL | Status: DC
  Administered 2015-05-12 – 2015-05-17 (×10): 300 mg via ORAL
  Filled 2015-05-12 (×11): qty 1

## 2015-05-12 MED ORDER — ALBUTEROL SULFATE HFA 108 (90 BASE) MCG/ACT IN AERS: 1.0000 | INHALATION_SPRAY | RESPIRATORY_TRACT | Status: DC | PRN

## 2015-05-12 MED ORDER — ZOLPIDEM TARTRATE 5 MG PO TABS: 5.0000 mg | ORAL_TABLET | Freq: Every evening | ORAL | Status: DC | PRN

## 2015-05-12 MED ORDER — LORATADINE 10 MG PO TABS
10.0000 mg | ORAL_TABLET | Freq: Every day | ORAL | Status: DC
  Administered 2015-05-12 – 2015-05-17 (×6): 10 mg via ORAL
  Filled 2015-05-12 (×6): qty 1

## 2015-05-12 MED ORDER — INSULIN ASPART 100 UNIT/ML ~~LOC~~ SOLN: 0.0000 [IU] | Freq: Three times a day (TID) | SUBCUTANEOUS | Status: DC

## 2015-05-12 MED ORDER — ONDANSETRON HCL 4 MG/2ML IJ SOLN: 4.0000 mg | Freq: Four times a day (QID) | INTRAMUSCULAR | Status: DC | PRN

## 2015-05-12 MED ORDER — LAMOTRIGINE 200 MG PO TABS
200.0000 mg | ORAL_TABLET | Freq: Every day | ORAL | Status: DC
  Administered 2015-05-12 – 2015-05-16 (×5): 200 mg via ORAL
  Filled 2015-05-12 (×6): qty 1

## 2015-05-12 MED ORDER — ALPRAZOLAM 0.25 MG PO TABS: 0.2500 mg | ORAL_TABLET | Freq: Two times a day (BID) | ORAL | Status: DC | PRN

## 2015-05-12 MED ORDER — ATORVASTATIN CALCIUM 40 MG PO TABS
40.0000 mg | ORAL_TABLET | Freq: Every day | ORAL | Status: DC
  Administered 2015-05-13 – 2015-05-16 (×4): 40 mg via ORAL
  Filled 2015-05-12 (×5): qty 1

## 2015-05-12 MED ORDER — CLONAZEPAM 0.5 MG PO TABS
1.0000 mg | ORAL_TABLET | Freq: Three times a day (TID) | ORAL | Status: DC
  Administered 2015-05-13 – 2015-05-17 (×12): 1 mg via ORAL
  Filled 2015-05-12 (×13): qty 2

## 2015-05-12 MED ORDER — HEPARIN SODIUM (PORCINE) 5000 UNIT/ML IJ SOLN
5000.0000 [IU] | Freq: Three times a day (TID) | INTRAMUSCULAR | Status: DC
  Administered 2015-05-12 – 2015-05-17 (×13): 5000 [IU] via SUBCUTANEOUS
  Filled 2015-05-12 (×16): qty 1

## 2015-05-12 MED ORDER — SODIUM CHLORIDE 0.9 % IJ SOLN: 3.0000 mL | INTRAMUSCULAR | Status: DC | PRN

## 2015-05-12 MED ORDER — MONTELUKAST SODIUM 10 MG PO TABS
10.0000 mg | ORAL_TABLET | Freq: Every day | ORAL | Status: DC
  Administered 2015-05-13 – 2015-05-17 (×5): 10 mg via ORAL
  Filled 2015-05-12 (×5): qty 1

## 2015-05-12 MED ORDER — OXCARBAZEPINE 300 MG PO TABS
600.0000 mg | ORAL_TABLET | Freq: Every day | ORAL | Status: DC
  Administered 2015-05-13 – 2015-05-17 (×5): 600 mg via ORAL
  Filled 2015-05-12 (×5): qty 2

## 2015-05-12 MED ORDER — SODIUM CHLORIDE 0.9 % IJ SOLN: 3.0000 mL | Freq: Two times a day (BID) | INTRAMUSCULAR | Status: DC

## 2015-05-12 MED ORDER — NITROGLYCERIN 0.4 MG SL SUBL: 0.4000 mg | SUBLINGUAL_TABLET | SUBLINGUAL | Status: DC | PRN

## 2015-05-12 MED ORDER — SODIUM CHLORIDE 0.9 % IV SOLN: 250.0000 mL | INTRAVENOUS | Status: DC | PRN

## 2015-05-12 NOTE — ED Notes (Signed)
Unable to take report [id number]

## 2015-05-12 NOTE — Progress Notes (Signed)
1830 Transferred in from ED via stretcher fully awake , alert and oriented .wirth infusing to R  Hand .Kept comfortable in bed . Familyly at bedside Oriented to unit done.

## 2015-05-12 NOTE — ED Notes (Signed)
Bed control has called his bed status has been changed to a tele bed now

## 2015-05-12 NOTE — ED Notes (Signed)
The pt has a cbg of 67  Sandwich given  Pt comfortable skin warm and dry

## 2015-05-12 NOTE — Telephone Encounter (Signed)
Critical lab called in to triage at [facility].  [facility]ed [facility] [place] [facility] and routed to Dr. Jens Somrenshaw  Creatinine 4.54  GFR  14.91

## 2015-05-12 NOTE — H&P (Signed)
Primary Cardiologist: Dr. Stanford Breed  Chief Complaint:  Instructed to come to hospital for acute renal insuficiency  HPI:  Todd Jackson is a 44 y.o. male with a hx of prior inferior posterior myocardial infarction in 2000. Cardiac catheterization in 2000 showed an occluded right coronary artery and PCI was preformed. Ejection fraction was 50% then. Echo was repeated 12/15 and showed an EF of 25%. Cardiac catheterization December 2015 showed no significant coronary disease and an ejection fraction of 25%. A MDT ICD was placed February 2016. Last seen by Dr. Kirk Ruths 12/14/14. Last seen by Dr. Cristopher Peru 03/21/15. ICD was functioning appropriately. No VT documented.    Todd Jackson developed weakness, fatigue and chest pain related to an asthma exacerbation on 04/20/15 and was treated at [facility] ED. He was then seen by Kathleen Argue PA 04/27/15 and his spironolactone was held for 2 days and his Cozaar was decreased. He called in with further complaints of lightheadedness with standing and his carvedilol dose was decreased. He was followed up by Truitt Merle NP 05/01/15 and was found to have orthostatic hypotension. This was felt to be related to chronic diarrhea from metformin which worsened with lasix. He had become near syncopal at work 2 days prior.  On 05/11/2015 he presented to [facility] ER with orthostatic symptoms. His creatinine was 3 and the ER physician felt that he was dry. Lasix and NSAIDs were dc'd. Pt felt to be stable for discharge and BMET was followed. Subsequent [name] 4.54, BUN 26 this am. Pt was contacted to be admitted and is now seen in the ED.   I discussed pt's medications with him. He tells me he was told to increase Lasix for 3 days when he called complaining of orthostatic symptoms. When he called again 2 days later he says he was told the same thing. I reviewed phone records. He was given extra Lasix x 2 days in May and was then instructed to only take Lasix PRN for  wgt gain - see phone report from Janan Halter RN on 04/07/15.    Most recent diagnostics: Echo 10/04/14 - Septal , apical and inferior wall hypokinesis.EF 25%. - Mitral valve: There was mild regurgitation. - Left atrium: The atrium was moderately dilated. - Atrial septum: No defect or patent foramen ovale was identified. - Pulmonary arteries: PA peak pressure: 35 mm Hg (S).  LHC 10/13/14 LM: Widely patent. LAD: widely patent. LCx: There is only mild disease in the circumflex system. RCA: The stent in the mid vessel is widely patent. There is mild disease in the distal RCA.  EF: 25%, inferior akinesis. LVEDP was 32 mmHg.  Myoview 11/17/13 High risk stress nuclear study with a large, severe intensity, fixed inferior defect consistent with prior inferior infarct; no ischemia; study high risk due to decreased LV function. LV Ejection Fraction: 34%. LV Wall Motion: Inferior akinesis.  Carotid US 11/29/08 Bilateral ICA < 50%  Past Medical History  Diagnosis Date  . CAD (coronary artery disease) 2000    PCI to RCA  . OSA (obstructive sleep apnea)   . Obesity   . Bipolar disorder   . Diverticulitis   . Ischemic cardiomyopathy 10/2014     EF 25% now with ICD- Medtronic  . Acute renal failure 05/12/2015  . Inferior myocardial infarction 2000  . Carotid arterial disease   . ICD (implantable cardioverter-defibrillator) in place 2016    Medtronic    Past Surgical History  Procedure  Laterality Date  . Right elbow surgery    . Left heart catheterization with coronary angiogram N/A 10/13/2014    Procedure: LEFT HEART CATHETERIZATION WITH CORONARY ANGIOGRAM;  Surgeon: Jettie Booze, MD;  Location: [facility] CATH LAB;  Service: Cardiovascular;  Laterality: N/A;  . Implantable cardioverter defibrillator implant N/A 12/09/2014    Procedure: IMPLANTABLE CARDIOVERTER DEFIBRILLATOR IMPLANT;  Surgeon: Evans Lance, MD;  Location: [facility] CATH LAB;  Service: Cardiovascular;  Laterality: N/A;  .  Coronary angioplasty with stent placement  2000    RCA stent    Family History  Problem Relation Age of Onset  . CAD Mother     MI at age 57  . Alcohol abuse Mother   . Colon cancer Father   . Diabetes Father    Social History:  reports that he quit smoking about 16 years ago. His smoking use included Cigarettes. He started smoking about 34 years ago. He has a 18 pack-year smoking history. His smokeless tobacco use includes Snuff. He reports that he drinks alcohol. His drug history is not on file.  Allergies: No Known Allergies  Outpatient Meds:  No current facility-administered medications on file prior to encounter.   Current Outpatient Prescriptions on File Prior to Encounter  Medication Sig Dispense Refill  . aspirin 325 MG tablet Take 325 mg by mouth daily.    . carvedilol (COREG) 25 MG tablet Take 0.5 tablets (12.5 mg total) by mouth 2 (two) times daily with a meal. 30 tablet 11  . clonazePAM (KLONOPIN) 1 MG tablet Take 1 mg by mouth 3 (three) times daily.     Marland Kitchen gabapentin (NEURONTIN) 300 MG capsule Take 300 mg by mouth 2 (two) times daily.    Marland Kitchen lamoTRIgine (LAMICTAL) 200 MG tablet Take 200 mg by mouth at bedtime.     . metFORMIN (GLUCOPHAGE) 1000 MG tablet Take 1 tablet (1,000 mg total) by mouth 2 (two) times daily.  0  . modafinil (PROVIGIL) 200 MG tablet Take 400 mg by mouth daily as needed. To keep me going per patient    . montelukast (SINGULAIR) 10 MG tablet Take 10 mg by mouth daily.     . nitroGLYCERIN (NITROSTAT) 0.4 MG SL tablet Place 1 tablet (0.4 mg total) under the tongue every 5 (five) minutes as needed for chest pain. 25 tablet 11  . Oxcarbazepine (TRILEPTAL) 300 MG tablet Take 300-900 mg by mouth See admin instructions. Take 2 tablet in the AM and 3 at night  0  . PROAIR HFA 108 (90 BASE) MCG/ACT inhaler Inhale 1-2 puffs into the lungs every 4 (four) hours as needed for wheezing or shortness of breath. Asthma  1  . simvastatin (ZOCOR) 80 MG tablet Take 1 tablet  (80 mg total) by mouth daily. 30 tablet 11  . furosemide (LASIX) 40 MG tablet Take 1 tablet (40 mg total) by mouth as needed. (Patient not taking: Reported on 05/12/2015) 30 tablet 11   Lasix stopped yesterday in ED  Results for orders placed or performed during the hospital encounter of 05/12/15 (from the past 48 hour(s))  CBC     Status: Abnormal   Collection Time: 05/12/15  3:40 PM  Result Value Ref Range   WBC 9.2 4.0 - 10.5 K/uL   RBC 3.71 (L) 4.22 - 5.81 MIL/uL   Hemoglobin 11.5 (L) 13.0 - 17.0 g/dL   HCT 33.2 (L) 39.0 - 52.0 %   MCV 89.5 78.0 - 100.0 fL   MCH 31.0 26.0 - 34.0  pg   MCHC 34.6 30.0 - 36.0 g/dL   RDW 14.9 11.5 - 15.5 %   Platelets 199 150 - 400 K/uL  Basic metabolic panel     Status: Abnormal   Collection Time: 05/12/15  3:40 PM  Result Value Ref Range   Sodium 135 135 - 145 mmol/L   Potassium 4.1 3.5 - 5.1 mmol/L   Chloride 103 101 - 111 mmol/L   CO2 20 (L) 22 - 32 mmol/L   Glucose, Bld 81 65 - 99 mg/dL   BUN 27 (H) 6 - 20 mg/dL   Creatinine, Ser 4.79 (H) 0.61 - 1.24 mg/dL   Calcium 9.0 8.9 - 10.3 mg/dL   GFR calc non Af Amer 13 (L) >60 mL/min   GFR calc Af Amer 15 (L) >60 mL/min    Comment: (NOTE) The eGFR has been calculated using the CKD EPI equation. This calculation has not been validated in all clinical situations. eGFR's persistently <60 mL/min signify possible Chronic Kidney Disease.    Anion gap 12 5 - 15  I-stat troponin, ED  (not at [facility])     Status: None   Collection Time: 05/12/15  3:57 PM  Result Value Ref Range   Troponin i, poc 0.01 0.00 - 0.08 ng/mL   Comment 3            Comment: Due to the release kinetics of cTnI, a negative result within the first hours of the onset of symptoms does not rule out myocardial infarction with certainty. If myocardial infarction is still suspected, repeat the test at appropriate intervals.    Dg Chest 2 View  05/12/2015   CLINICAL DATA:  Hypertension and syncope  EXAM: CHEST  2 VIEW   COMPARISON:  Chest radiograph Mar 23, 2015; chest CT Mar 23, 2015  FINDINGS: There is no edema or consolidation. Heart size and pulmonary vascularity are normal. No adenopathy. Pacemaker present with lead tip attached to the left ventricle. No bone lesions.  IMPRESSION: No edema or consolidation.   Electronically Signed   By: Lowella Grip III M.D.   On: 05/12/2015 15:54    ROS: unremarkable except where noted above. No CHF symptoms, no chest pain.   Blood pressure 129/74, pulse 74, temperature 98.3 F (36.8 C), temperature source Oral, resp. rate 16, height 6' 1"  (1.854 m), SpO2 98 %.   Physical Exam  Constitutional: He is oriented to person, place, and time. He appears well-developed. No distress.  HENT:  Head: Normocephalic.  Eyes: EOM are normal. Pupils are equal, round, and reactive to light.  Neck: Neck supple. No JVD present.  Cardiovascular: Normal rate, regular rhythm, normal heart sounds and intact distal pulses.  Exam reveals no friction rub.   No murmur heard. Pulmonary/Chest: No stridor.  Abdominal: Soft. Bowel sounds are normal.  Musculoskeletal: He exhibits no edema or tenderness.  Neurological: He is alert and oriented to person, place, and time.  Skin: Skin is warm and dry. He is not diaphoretic.     Assessment/Plan Principal Problem:   Acute renal insufficiency ? Confusion about medication instructions  Active Problems:   CAD- s/p remote RCA PCI, patent Dec 2015   OSA (obstructive sleep apnea)- on C-pap   Obesity   Bipolar disorder   Cardiomyopathy, presumably non ischemic, new Dec 2015, cath patent coronaries   Chronic systolic heart failure   S/P MDT ICD Feb 2016      Plan: Admit, hydrate gently- (NS 75 hr x 8 hrs, then  50 cc hr), stop Glucophage, call renal in am if SCr doesn't improve.   Kerin Ransom PA-C 05/12/2015 5:04 PM

## 2015-05-12 NOTE — ED Notes (Signed)
Report called to nellie rn

## 2015-05-12 NOTE — Addendum Note (Signed)
Addended by: Tonita PhoenixBOWDEN, ROBIN K on: 05/12/2015 10:47 AM   Modules accepted: Orders

## 2015-05-12 NOTE — Telephone Encounter (Signed)
Per Dr. Jens Somrenshaw, patient [name] jumped from 1.06 on 6/27 to 4.54 today. I have contacted the patient and advised him to [facility] for severe acute renal injury. Patient displayed understanding of the situation and will come as soon as he's free. He states he has a followup with cardiology next Monday, i have advised against waiting until next week given the severity. Cardiology DOD has been made aware of his coming. We will contact nephrology once he is admitted.  Note he has EF 30-35%, likely will do soft hydration with IVF at 4550ml/hr to avoid HF.   Ramond DialSigned, Danikah Budzik PA Pager: [phone]

## 2015-05-12 NOTE — ED Notes (Signed)
The pt was sent here from dr Waunita Schoonercrenshaws office because he has a high creat.  He has no pain anywhere  He has started having passing out spells for the past 2-3 days.  Alert oriented skin warm and dry

## 2015-05-12 NOTE — Progress Notes (Signed)
Utilization review completed.  L. J. Aison Malveaux RN, BSN, CM 

## 2015-05-12 NOTE — Telephone Encounter (Signed)
Dr Jens Somrenshaw gave me a verbal order to send the patient to [facility].  I contacted patient and advised him to [facility]  for [facility] due to elevated Creatnine level.  Patient verbalized understanding.

## 2015-05-12 NOTE — ED Notes (Addendum)
Pt reports having syncopal episode yesterday and went [facility]. Had appt today for labs, was called and told to come here due to elevated BUN and Creatinine. Pt denies any cp or sob.

## 2015-05-13 ENCOUNTER — Inpatient Hospital Stay (HOSPITAL_COMMUNITY): Payer: BLUE CROSS/BLUE SHIELD

## 2015-05-13 DIAGNOSIS — F3178 Bipolar disorder, in full remission, most recent episode mixed: Secondary | ICD-10-CM

## 2015-05-13 DIAGNOSIS — E669 Obesity, unspecified: Secondary | ICD-10-CM

## 2015-05-13 LAB — GLUCOSE, CAPILLARY
GLUCOSE-CAPILLARY: 101 mg/dL — AB (ref 65–99)
GLUCOSE-CAPILLARY: 118 mg/dL — AB (ref 65–99)
Glucose-Capillary: 106 mg/dL — ABNORMAL HIGH (ref 65–99)
Glucose-Capillary: 85 mg/dL (ref 65–99)

## 2015-05-13 LAB — BASIC METABOLIC PANEL
ANION GAP: 9 (ref 5–15)
Anion gap: 10 (ref 5–15)
BUN: 26 mg/dL — ABNORMAL HIGH (ref 6–20)
BUN: 25 mg/dL — AB (ref 6–20)
CALCIUM: 8.7 mg/dL — AB (ref 8.9–10.3)
CO2: 24 mmol/L (ref 22–32)
CO2: 26 mmol/L (ref 22–32)
CREATININE: 3.86 mg/dL — AB (ref 0.61–1.24)
Calcium: 8.5 mg/dL — ABNORMAL LOW (ref 8.9–10.3)
Chloride: 102 mmol/L (ref 101–111)
Chloride: 105 mmol/L (ref 101–111)
Creatinine, Ser: 4.39 mg/dL — ABNORMAL HIGH (ref 0.61–1.24)
GFR calc Af Amer: 17 mL/min — ABNORMAL LOW (ref >60)
GFR calc non Af Amer: 15 mL/min — ABNORMAL LOW (ref >60)
GFR calc non Af Amer: 17 mL/min — ABNORMAL LOW (ref >60)
GFR, EST AFRICAN AMERICAN: 20 mL/min — AB (ref >60)
Glucose, Bld: 104 mg/dL — ABNORMAL HIGH (ref 65–99)
Glucose, Bld: 120 mg/dL — ABNORMAL HIGH (ref 65–99)
Potassium: 3.6 mmol/L (ref 3.5–5.1)
Potassium: 4 mmol/L (ref 3.5–5.1)
Sodium: 136 mmol/L (ref 135–145)
Sodium: 140 mmol/L (ref 135–145)

## 2015-05-13 LAB — URINALYSIS, ROUTINE W REFLEX MICROSCOPIC
Bilirubin Urine: NEGATIVE
GLUCOSE, UA: NEGATIVE mg/dL
Ketones, ur: NEGATIVE mg/dL
Leukocytes, UA: NEGATIVE
Nitrite: NEGATIVE
PH: 5 (ref 5.0–8.0)
Protein, ur: NEGATIVE mg/dL
SPECIFIC GRAVITY, URINE: 1.007 (ref 1.005–1.030)
Urobilinogen, UA: 0.2 mg/dL (ref 0.0–1.0)

## 2015-05-13 LAB — URINE MICROSCOPIC-ADD ON

## 2015-05-13 MED ORDER — SODIUM CHLORIDE 0.9 % IV SOLN: INTRAVENOUS | Status: DC

## 2015-05-13 NOTE — Consult Note (Signed)
Renal Service Consult Note [facility]  Mickie Kozikowski 05/13/2015 [place] Requesting Physician:  Dr Stanford Breed  Reason for Consult:  AKI HPI: The patient is a 44 y.o. year-old with hx of CAD/ PCI, OSA, obesity, bipolar, ICM EF 25%, ICD implant, HTN.  Presented to ED, had syncopal episodes for a couple weeks before presenting and went to outside hospital. Had appt today for labs and labs were bad so told to come to ED here and was admitted. B/Cr up.  Creat 4.5.  Main complaint was passing out for 3-4 days. He had ICD placed in Feb this year and was given lasix at dc. Took the lasix and then had passing out spells, was told to stop the lasix but continued to take it.  Admitted and started on IVF's.  Creat not much better today.   In MD's office he says the sitting BP was normal adn the standing BP was 65/35.  He had lightheadedness issues since about Jan but only starting passing out the last month or so.  Losartan was stopped about 3 weeks ago.  Still taking lasix about 3 of every 4 days. The aldactone was stopped also with the losartan about 3 wks ago for syncope as an outpatient. He says he had fluid in his lungs in May because the ICD read the fluid levels and Dr Lovena Le put him on lasix.    Date  Creat  eGFR 12/14  1.00 12/15  0.98 02/16  1.02 6/16  1.0- 1.70  05/12/15  4.54 05/13/15  4.39  17  UA 7/9 > neg protein, 0-2 rbc/ wbc, 1.007 CXR 7/8 > no acute disease ECHO 05/04/15 > LVEF 30-35%, focal WMA's, diast dysfunction  Home meds > asa, coreg, klonopin, neurontin, lamictoal, xyzal (dc'd), glucophage, provigil, singulair, ntg sl, trileptal, proair, zocor, lasix 40 prn   Chart review: Feb '16 > 27 yo with ICM, s/p MI 2000 rx with PCI to 100% RCA. EF down 24%, admitted and underwent ICD implantation. No complications. Dc on losartan, aldactone, coreg   ROS  +chest pressures, sob, fatigue  + no abd pain , n./v/d  no joint pain pains, no skin rash, +pounding HA  +diarrhea due to metformin  Past Medical History  Past Medical History  Diagnosis Date  . CAD (coronary artery disease) 2000    PCI to RCA  . OSA (obstructive sleep apnea)   . Obesity   . Bipolar disorder   . Diverticulitis   . Ischemic cardiomyopathy 10/2014     EF 25% now with ICD- Medtronic  . Acute renal failure 05/12/2015  . Inferior myocardial infarction 2000  . Carotid arterial disease   . ICD (implantable cardioverter-defibrillator) in place 2016    Medtronic   Past Surgical History  Past Surgical History  Procedure Laterality Date  . Right elbow surgery    . Left heart catheterization with coronary angiogram N/A 10/13/2014    Procedure: LEFT HEART CATHETERIZATION WITH CORONARY ANGIOGRAM;  Surgeon: Jettie Booze, MD;  Location: [facility] CATH LAB;  Service: Cardiovascular;  Laterality: N/A;  . Implantable cardioverter defibrillator implant N/A 12/09/2014    Procedure: IMPLANTABLE CARDIOVERTER DEFIBRILLATOR IMPLANT;  Surgeon: Evans Lance, MD;  Location: [facility] CATH LAB;  Service: Cardiovascular;  Laterality: N/A;  . Coronary angioplasty with stent placement  2000    RCA stent   Family History  Family History  Problem Relation Age of Onset  . CAD Mother     MI at age 89  .  Alcohol abuse Mother   . Colon cancer Father   . Diabetes Father    Social History  reports that he quit smoking about 16 years ago. His smoking use included Cigarettes. He started smoking about 34 years ago. He has a 18 pack-year smoking history. His smokeless tobacco use includes Snuff. He reports that he drinks alcohol. His drug history is not on file. Allergies No Known Allergies Home medications Prior to Admission medications   Medication Sig Start Date End Date Taking? Authorizing Provider  aspirin 325 MG tablet Take 325 mg by mouth daily.   Yes Historical Provider, MD  carvedilol (COREG) 25 MG tablet Take 0.5 tablets (12.5 mg total) by mouth 2 (two) times daily with a meal. 04/27/15  Yes Scott T  Kathlen Mody, PA-C  clonazePAM (KLONOPIN) 1 MG tablet Take 1 mg by mouth 3 (three) times daily.    Yes Historical Provider, MD  gabapentin (NEURONTIN) 300 MG capsule Take 300 mg by mouth 2 (two) times daily.   Yes Historical Provider, MD  lamoTRIgine (LAMICTAL) 200 MG tablet Take 200 mg by mouth at bedtime.  10/12/13  Yes Historical Provider, MD  levocetirizine (XYZAL) 5 MG tablet Take 10 mg by mouth every evening.   Yes Historical Provider, MD  metFORMIN (GLUCOPHAGE) 1000 MG tablet Take 1 tablet (1,000 mg total) by mouth 2 (two) times daily. 10/15/14  Yes Jettie Booze, MD  modafinil (PROVIGIL) 200 MG tablet Take 400 mg by mouth daily as needed. To keep me going per patient 10/12/13  Yes Historical Provider, MD  montelukast (SINGULAIR) 10 MG tablet Take 10 mg by mouth daily.    Yes Historical Provider, MD  nitroGLYCERIN (NITROSTAT) 0.4 MG SL tablet Place 1 tablet (0.4 mg total) under the tongue every 5 (five) minutes as needed for chest pain. 12/14/14  Yes Lelon Perla, MD  Oxcarbazepine (TRILEPTAL) 300 MG tablet Take 300-900 mg by mouth See admin instructions. Take 2 tablet in the AM and 3 at night 09/18/14  Yes Historical Provider, MD  PROAIR HFA 108 (90 BASE) MCG/ACT inhaler Inhale 1-2 puffs into the lungs every 4 (four) hours as needed for wheezing or shortness of breath. Asthma 08/26/14  Yes Historical Provider, MD  simvastatin (ZOCOR) 80 MG tablet Take 1 tablet (80 mg total) by mouth daily. 12/14/14  Yes Lelon Perla, MD  furosemide (LASIX) 40 MG tablet Take 1 tablet (40 mg total) by mouth as needed. Patient not taking: Reported on 05/12/2015 12/14/14   Lelon Perla, MD   Liver Function Tests No results for input(s): AST, ALT, ALKPHOS, BILITOT, PROT, ALBUMIN in the last 168 hours. No results for input(s): LIPASE, AMYLASE in the last 168 hours. CBC  Recent Labs Lab 05/12/15 1540  WBC 9.2  HGB 11.5*  HCT 33.2*  MCV 89.5  PLT 939   Basic Metabolic Panel  Recent Labs Lab  05/12/15 1047 05/12/15 1540 05/13/15 0300  NA 137 135 136  K 3.7 4.1 3.6  CL 100 103 102  CO2 24 20* 24  GLUCOSE 116* 81 104*  BUN 26* 27* 26*  CREATININE 4.54* 4.79* 4.39*  CALCIUM 9.4 9.0 8.5*    Filed Vitals:   05/12/15 2120 05/13/15 0209 05/13/15 0629 05/13/15 1500  BP: 129/73 122/68 122/67 112/76  Pulse: 85 79 66 76  Temp: 97.8 F (36.6 C) 97.9 F (36.6 C) 97.7 F (36.5 C) 97.7 F (36.5 C)  TempSrc: Oral Oral Oral Oral  Resp: 20 18 18 20   Height:  Weight:   130.999 kg (288 lb 12.8 oz)   SpO2: 100% 95% 94% 97%   Exam Alert, no distress No rash, cyanosis or gangrene Sclera anicteric, throat clear No jvd Chest clear bilat RRR no MRG ICD left upper chest Abd sfot, obese, ntnd , no mass or ascites No LE edema Neuro alert, Ox 3  Assessment: 1. Acute renal failure - UA normal , suspect vol depletion +/- ATN. Off ARB / aldactone x 3 weeks but still taking lasix. Stopping all diuretics and giving IVF, cont to avoid diuretics unless he has bonafide pulm edema by CXR.  Will increase IVF's slowly. Hold coreg, let BP come up some as well.  2. Cardiomyopathy EF 30-35%, recent ICD placement 3. CAD s/p PCI 4. DM 2 oral agents 5. OSA/ obesity 6. HTN   Plan- dc coreg, cont IVF's, renal US and follow labs. Will follow.   Kelly Splinter MD (pgr) [phone]    ([phone] 05/13/2015, 3:35 PM

## 2015-05-13 NOTE — Progress Notes (Signed)
Subjective:  Comfortable in bed. No SOB. IVF at 50/hr now.   Objective:  Vital Signs in the last 24 hours: Temp:  [97.7 F (36.5 C)-98.3 F (36.8 C)] 97.7 F (36.5 C) (07/09 0629) Pulse Rate:  [66-85] 66 (07/09 0629) Resp:  [14-20] 18 (07/09 0629) BP: (120-129)/(61-75) 122/67 mmHg (07/09 0629) SpO2:  [94 %-100 %] 94 % (07/09 0629) Weight:  [286 lb 13.1 oz (130.1 kg)-288 lb 12.8 oz (130.999 kg)] 288 lb 12.8 oz (130.999 kg) (07/09 0629)  Intake/Output from previous day: 07/08 0701 - 07/09 0700 In: -  Out: 1600 [Urine:1600]   Physical Exam: General: Well developed, well nourished, in no acute distress. Head:  Normocephalic and atraumatic. Lungs: Clear to auscultation and percussion. Heart: Normal S1 and S2.  No murmur, rubs or gallops.  Abdomen: soft, non-tender, positive bowel sounds. obese Extremities: No clubbing or cyanosis. No edema. Neurologic: Alert and oriented x 3.    Lab Results:  Recent Labs  05/12/15 1540  WBC 9.2  HGB 11.5*  PLT 199    Recent Labs  05/12/15 1540 05/13/15 0300  NA 135 136  K 4.1 3.6  CL 103 102  CO2 20* 24  GLUCOSE 81 104*  BUN 27* 26*  CREATININE 4.79* 4.39*    Imaging: Dg Chest 2 View  05/12/2015   CLINICAL DATA:  Hypertension and syncope  EXAM: CHEST  2 VIEW  COMPARISON:  Chest radiograph Mar 23, 2015; chest CT Mar 23, 2015  FINDINGS: There is no edema or consolidation. Heart size and pulmonary vascularity are normal. No adenopathy. Pacemaker present with lead tip attached to the left ventricle. No bone lesions.  IMPRESSION: No edema or consolidation.   Electronically Signed   By: Bretta Bang III M.D.   On: 05/12/2015 15:54   Personally viewed.   Telemetry: NSR Personally viewed.   Cardiac Studies:  ECHO EF 25%  Scheduled Meds: . aspirin EC  81 mg Oral Daily  . atorvastatin  40 mg Oral q1800  . carvedilol  12.5 mg Oral BID WC  . clonazePAM  1 mg Oral TID  . gabapentin  300 mg Oral BID  . heparin  5,000 Units  Subcutaneous 3 times per day  . insulin aspart  0-15 Units Subcutaneous TID WC  . insulin aspart  0-5 Units Subcutaneous QHS  . lamoTRIgine  200 mg Oral QHS  . loratadine  10 mg Oral Daily  . montelukast  10 mg Oral Daily  . Oxcarbazepine  600 mg Oral Daily  . Oxcarbazepine  900 mg Oral QHS   Continuous Infusions: . 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 Acute renal failure  - hopefully from overdiuresis, however did not respond to IVF overnight as I would have expected.  - Off lasix  - Off NSAIDs  - Off Cozaar  - Off metformin  - Checking renal u/s, and UA  - Consulting renal team. Hopeful resolution soon with IVF increased to 9ml/hr. Watch for signs of CHF.   2. Chronic systolic heart failure  - Continue coreg  - off ARB secondary  to renal issue  - stable currently with IVF.   3. Obesity  - encourage weight loss  4. Non ischemic cardiomyopathy  - remote RCA PCI  5. ICD in place  6. DM - montior  7. Bipolar d/o - meds as above.   Atsushi Yom 05/13/2015, 11:17 AM

## 2015-05-14 LAB — GLUCOSE, CAPILLARY
GLUCOSE-CAPILLARY: 90 mg/dL (ref 65–99)
Glucose-Capillary: 86 mg/dL (ref 65–99)
Glucose-Capillary: 78 mg/dL (ref 65–99)

## 2015-05-14 LAB — BASIC METABOLIC PANEL
Anion gap: 10 (ref 5–15)
BUN: 25 mg/dL — ABNORMAL HIGH (ref 6–20)
CO2: 24 mmol/L (ref 22–32)
Calcium: 8.6 mg/dL — ABNORMAL LOW (ref 8.9–10.3)
Chloride: 108 mmol/L (ref 101–111)
Creatinine, Ser: 3.45 mg/dL — ABNORMAL HIGH (ref 0.61–1.24)
GFR calc Af Amer: 23 mL/min — ABNORMAL LOW (ref >60)
GFR calc non Af Amer: 20 mL/min — ABNORMAL LOW (ref >60)
Glucose, Bld: 99 mg/dL (ref 65–99)
Potassium: 3.6 mmol/L (ref 3.5–5.1)
Sodium: 142 mmol/L (ref 135–145)

## 2015-05-14 MED ORDER — SODIUM CHLORIDE 0.9 % IV SOLN
INTRAVENOUS | Status: AC
  Administered 2015-05-14: 12:00:00 via INTRAVENOUS

## 2015-05-14 NOTE — Progress Notes (Signed)
  [facility] Progress Note   Subjective: feeling fine, no SOB, creat down 3.45  Filed Vitals:   05/13/15 0629 05/13/15 1500 05/13/15 2039 05/14/15 0435  BP: 122/67 112/76 117/64 115/67  Pulse: 66 76 75 74  Temp: 97.7 F (36.5 C) 97.7 F (36.5 C) 98.7 F (37.1 C) 97.9 F (36.6 C)  TempSrc: Oral Oral Oral Oral  Resp: 18 20 20 20   Height:      Weight: 130.999 kg (288 lb 12.8 oz)   131.271 kg (289 lb 6.4 oz)  SpO2: 94% 97% 94% 97%   Exam: Alert, no distress No rash, cyanosis or gangrene Sclera anicteric, throat clear No jvd Chest clear bilat RRR no MRG ICD left upper chest Abd soft obese, ntnd , no mass or ascites No LE edema Neuro alert, Ox 3  Assessment: 1. Acute renal failure - UA normal , suspect vol depletion +/- ATN. Starting to recover, creat down to 3.45 today. All prior CXR's in system and one chest CT are clear of edema. May need to look for other causes of dyspnea/ leg edema, such as RV failure for example.  2. Cardiomyopathy EF 30-35%, recent ICD placement 3. CAD s/p PCI 4. DM 2 oral agents 5. OSA/ obesity 6. HTN  Plan - cont IVF"s, decrease saline to 50 cc/ hr    Todd Jackson Todd Cameron MD  pager [phone]    cell [phone]  05/14/2015, 11:15 AM     Recent Labs Lab 05/13/15 0300 05/13/15 2050 05/14/15 0311  NA 136 140 142  K 3.6 4.0 3.6  CL 102 105 108  CO2 24 26 24   GLUCOSE 104* 120* 99  BUN 26* 25* 25*  CREATININE 4.39* 3.86* 3.45*  CALCIUM 8.5* 8.7* 8.6*   No results for input(s): AST, ALT, ALKPHOS, BILITOT, PROT, ALBUMIN in the last 168 hours.  Recent Labs Lab 05/12/15 1540  WBC 9.2  HGB 11.5*  HCT 33.2*  MCV 89.5  PLT 199   . aspirin EC  81 mg Oral Daily  . atorvastatin  40 mg Oral q1800  . clonazePAM  1 mg Oral TID  . gabapentin  300 mg Oral BID  . heparin  5,000 Units Subcutaneous 3 times per day  . insulin aspart  0-15 Units Subcutaneous TID WC  . insulin aspart  0-5 Units Subcutaneous QHS  . lamoTRIgine  200 mg  Oral QHS  . loratadine  10 mg Oral Daily  . montelukast  10 mg Oral Daily  . Oxcarbazepine  600 mg Oral Daily  . Oxcarbazepine  900 mg Oral QHS     acetaminophen, albuterol, ALPRAZolam, nitroGLYCERIN, ondansetron (ZOFRAN) IV, zolpidem

## 2015-05-14 NOTE — Progress Notes (Signed)
Subjective:    No SOB at rest  Objective:  Vital Signs in the last 24 hours: Temp:  [97.7 F (36.5 C)-98.7 F (37.1 C)] 97.9 F (36.6 C) (07/10 0435) Pulse Rate:  [74-76] 74 (07/10 0435) Resp:  [20] 20 (07/10 0435) BP: (112-117)/(64-76) 115/67 mmHg (07/10 0435) SpO2:  [94 %-97 %] 97 % (07/10 0435) Weight:  [289 lb 6.4 oz (131.271 kg)] 289 lb 6.4 oz (131.271 kg) (07/10 0435)  Intake/Output from previous day:  Intake/Output Summary (Last 24 hours) at 05/14/15 0940 Last data filed at 05/14/15 0600  Gross per 24 hour  Intake      0 ml  Output   1000 ml  Net  -1000 ml    Physical Exam: General appearance: alert, cooperative, no distress and moderately obese Neck: no carotid bruit and no JVD Lungs: clear to auscultation bilaterally Heart: regular rate and rhythm Extremities: no edema Skin: cool and dry Neurologic: Grossly normal   Rate: 76  Rhythm: normal sinus rhythm  Lab Results:  Recent Labs  05/12/15 1540  WBC 9.2  HGB 11.5*  PLT 199    Recent Labs  05/13/15 2050 05/14/15 0311  NA 140 142  K 4.0 3.6  CL 105 108  CO2 26 24  GLUCOSE 120* 99  BUN 25* 25*  CREATININE 3.86* 3.45*   No results for input(s): TROPONINI in the last 72 hours.  Invalid input(s): CK, MB No results for input(s): INR in the last 72 hours.  Scheduled Meds: . aspirin EC  81 mg Oral Daily  . atorvastatin  40 mg Oral q1800  . clonazePAM  1 mg Oral TID  . gabapentin  300 mg Oral BID  . heparin  5,000 Units Subcutaneous 3 times per day  . insulin aspart  0-15 Units Subcutaneous TID WC  . insulin aspart  0-5 Units Subcutaneous QHS  . lamoTRIgine  200 mg Oral QHS  . loratadine  10 mg Oral Daily  . montelukast  10 mg Oral Daily  . Oxcarbazepine  600 mg Oral Daily  . Oxcarbazepine  900 mg Oral QHS   Continuous Infusions: . sodium chloride     PRN Meds:.acetaminophen, albuterol, ALPRAZolam, nitroGLYCERIN, ondansetron (ZOFRAN) IV, zolpidem   Imaging: Dg Chest 2  View  05/12/2015   CLINICAL DATA:  Hypertension and syncope  EXAM: CHEST  2 VIEW  COMPARISON:  Chest radiograph Mar 23, 2015; chest CT Mar 23, 2015  FINDINGS: There is no edema or consolidation. Heart size and pulmonary vascularity are normal. No adenopathy. Pacemaker present with lead tip attached to the left ventricle. No bone lesions.  IMPRESSION: No edema or consolidation.   Electronically Signed   By: Bretta Bang III M.D.   On: 05/12/2015 15:54   US Renal  05/13/2015   CLINICAL DATA:  44 year old male with acute renal failure.  EXAM: RENAL / URINARY TRACT ULTRASOUND COMPLETE  COMPARISON:  None.  FINDINGS: Right Kidney:  Length: 13.8 cm. Echogenicity within normal limits. No mass or hydronephrosis visualized.  Left Kidney:  Length: 13.9 cm. Echogenicity within normal limits. No mass or hydronephrosis visualized.  Bladder:  Appears normal for degree of bladder distention.  IMPRESSION: Normal renal ultrasound.   Electronically Signed   By: Harmon Pier M.D.   On: 05/13/2015 20:34    Cardiac Studies: Echo 05/04/15 Study Conclusions  - Left ventricle: The cavity size was mildly dilated. Systolic function was moderately to severely reduced. The estimated ejection fraction was in the range of  30% to 35%. Diffuse hypokinesis. There is akinesis of the entireinferolateral and inferior myocardium. Features are consistent with a pseudonormal left ventricular filling pattern, with concomitant abnormal relaxation and increased filling pressure (grade 2 diastolic dysfunction). Doppler parameters are consistent with high ventricular filling pressure. - Pericardium, extracardiac: A trivial pericardial effusion was identified posterior to the heart.  Assessment/Plan:  44 y.o. year-old with hx of CAD/ PCI, OSA, obesity, bipolar, ICM EF 25%, ICD implant, HTN. He had ICD placed in Feb this year and was given lasix at dc. He had orthostatic symptoms and there were multiple phone calls, an  ED visit to an outside hospital, and two recent office visits. In reviewing his phone records he was getting mixed instructions on what to do with his diuretics. He continued to take Lasix and his SCr rose to 4.5 and he was admitted 05/12/15.    Principal Problem:   Acute renal insufficiency Active Problems:   Cardiomyopathy- apparently non ischemic based on cath Dec 2015   CAD S/P reomte CA PCI 2000, patent Dec 2015   OSA (obstructive sleep apnea)   Obesity   Bipolar disorder   S/P MDT ICD Feb 2016   Essential hypertension   PLAN:  SCr coming down.  IVF per renal, watch for CHF.   Corine ShelterLuke Kilroy PA-C 05/14/2015, 9:40 AM [phone]  Personally seen and examined. Agree with above.  44 year old with EF 25%, recent orthostatic symptoms with elevated creat in 4 range.  1. Acute renal failure - likely from overdiuresis, responding slowly to IVF - Off lasix (was given mixed messages on to take or not to take lasix) - Off NSAIDs - Off Cozaar - Off metformin - Checked renal U/S normal , and UA no adverse findings - Appreciate renal team. Hopeful resolution soon with IVF increased to 3475ml/hr. Watch for signs of CHF.   2. Chronic systolic heart failure - Continue coreg - off ARB secondary to renal issue - stable currently with IVF.   3. Obesity - encourage weight loss  4. Non ischemic cardiomyopathy - remote RCA PCI  - stable, no angina  5. ICD in place  6. DM - montior  7. Bipolar d/o - meds as above.   Curby Carswell

## 2015-05-15 ENCOUNTER — Encounter: Payer: BLUE CROSS/BLUE SHIELD | Admitting: Nurse Practitioner

## 2015-05-15 ENCOUNTER — Other Ambulatory Visit: Payer: BLUE CROSS/BLUE SHIELD

## 2015-05-15 LAB — BASIC METABOLIC PANEL
Anion gap: 9 (ref 5–15)
BUN: 23 mg/dL — ABNORMAL HIGH (ref 6–20)
CO2: 23 mmol/L (ref 22–32)
Calcium: 8.4 mg/dL — ABNORMAL LOW (ref 8.9–10.3)
Chloride: 111 mmol/L (ref 101–111)
Creatinine, Ser: 2.47 mg/dL — ABNORMAL HIGH (ref 0.61–1.24)
GFR calc Af Amer: 34 mL/min — ABNORMAL LOW (ref >60)
GFR calc non Af Amer: 30 mL/min — ABNORMAL LOW (ref >60)
Glucose, Bld: 121 mg/dL — ABNORMAL HIGH (ref 65–99)
Potassium: 3.9 mmol/L (ref 3.5–5.1)
Sodium: 143 mmol/L (ref 135–145)

## 2015-05-15 LAB — GLUCOSE, CAPILLARY
GLUCOSE-CAPILLARY: 97 mg/dL (ref 65–99)
GLUCOSE-CAPILLARY: 93 mg/dL (ref 65–99)
GLUCOSE-CAPILLARY: 106 mg/dL — AB (ref 65–99)
Glucose-Capillary: 108 mg/dL — ABNORMAL HIGH (ref 65–99)
Glucose-Capillary: 109 mg/dL — ABNORMAL HIGH (ref 65–99)
Glucose-Capillary: 63 mg/dL — ABNORMAL LOW (ref 65–99)
Glucose-Capillary: 78 mg/dL (ref 65–99)
Glucose-Capillary: 83 mg/dL (ref 65–99)

## 2015-05-15 LAB — HEMOGLOBIN A1C
Hgb A1c MFr Bld: 5.5 % (ref 4.8–5.6)
Mean Plasma Glucose: 111 mg/dL

## 2015-05-15 MED ORDER — DEXTROSE 50 % IV SOLN
1.0000 | Freq: Once | INTRAVENOUS | Status: AC
  Administered 2015-05-15: 50 mL via INTRAVENOUS

## 2015-05-15 MED ORDER — DEXTROSE 50 % IV SOLN
INTRAVENOUS | Status: AC
  Filled 2015-05-15: qty 50

## 2015-05-15 NOTE — Progress Notes (Signed)
Patient Name: Todd Jackson Date of Encounter: 05/15/2015  Principal Problem:   Acute renal insufficiency Active Problems:   CAD S/P reomte CA PCI 2000, patent Dec 2015   OSA (obstructive sleep apnea)   Obesity   Bipolar disorder   Cardiomyopathy- apparently non ischemic based on cath Dec 2015   S/P MDT ICD Feb 2016   Essential hypertension   Primary Cardiologist: Dr Jens Som  Patient Profile: 44 y.o. year-old with hx of CAD/ PCI, OSA, obesity, bipolar, ICM EF 25%, ICD implant, HTN. He had ICD placed in Feb this year and was given lasix at dc. He had orthostatic symptoms and there were multiple phone calls, an ED visit to an outside hospital, and two recent office visits. In reviewing his phone records he was getting mixed instructions on what to do with his diuretics. He continued to take Lasix and his SCr rose to 4.5 and he was admitted 05/12/15.   SUBJECTIVE: Gets weak when he gets up, still getting light-headed. DOE still significant.  OBJECTIVE Filed Vitals:   05/14/15 0435 05/14/15 1400 05/14/15 2057 05/15/15 0701  BP: 115/67 135/77 130/87 120/74  Pulse: 74 79 71 72  Temp: 97.9 F (36.6 C) 98 F (36.7 C) 98.4 F (36.9 C) 98.4 F (36.9 C)  TempSrc: Oral Oral Oral Oral  Resp: Height:      Weight: 289 lb 6.4 oz (131.271 kg)   288 lb 1.6 oz (130.681 kg)  SpO2: 97% 95% 97% 97%    Intake/Output Summary (Last 24 hours) at 05/15/15 0903 Last data filed at 05/15/15 0846  Gross per 24 hour  Intake 1642.5 ml  Output   4025 ml  Net -2382.5 ml   Filed Weights   05/13/15 0629 05/14/15 0435 05/15/15 0701  Weight: 288 lb 12.8 oz (130.999 kg) 289 lb 6.4 oz (131.271 kg) 288 lb 1.6 oz (130.681 kg)    PHYSICAL EXAM General: Well developed, well nourished, male in no acute distress. Head: Normocephalic, atraumatic.  Neck: Supple without bruits, JVD. Lungs:  Resp regular and unlabored, CTA. Heart: RRR, S1, S2, no S3, S4, or murmur; no rub. Abdomen: Soft,  non-tender, non-distended, BS + x 4.  Extremities: No clubbing, cyanosis, edema.  Neuro: Alert and oriented X 3. Moves all extremities spontaneously. Psych: Normal affect.  LABS: CBC: Recent Labs  05/12/15 1540  WBC 9.2  HGB 11.5*  HCT 33.2*  MCV 89.5  PLT 199   Basic Metabolic Panel: Recent Labs  05/14/15 0311 05/15/15 0411  NA 142 143  K 3.6 3.9  CL 108 111  CO2 24 23  GLUCOSE 99 121*  BUN 25* 23*  CREATININE 3.45* 2.47*  CALCIUM 8.6* 8.4*    Recent Labs  05/12/15 1557  TROPIPOC 0.01   TELE: SR, PACs and PVCs  Radiology/Studies: US Renal 05/13/2015   CLINICAL DATA:  44 year old male with acute renal failure.  EXAM: RENAL / URINARY TRACT ULTRASOUND COMPLETE  COMPARISON:  None.  FINDINGS: Right Kidney:  Length: 13.8 cm. Echogenicity within normal limits. No mass or hydronephrosis visualized.  Left Kidney:  Length: 13.9 cm. Echogenicity within normal limits. No mass or hydronephrosis visualized.  Bladder:  Appears normal for degree of bladder distention.  IMPRESSION: Normal renal ultrasound.   Electronically Signed   By: Harmon Pier M.D.   On: 05/13/2015 20:34     Current Medications:  . aspirin EC  81 mg Oral Daily  . atorvastatin  40 mg Oral q1800  .  clonazePAM  1 mg Oral TID  . gabapentin  300 mg Oral BID  . heparin  5,000 Units Subcutaneous 3 times per day  . insulin aspart  0-15 Units Subcutaneous TID WC  . insulin aspart  0-5 Units Subcutaneous QHS  . lamoTRIgine  200 mg Oral QHS  . loratadine  10 mg Oral Daily  . montelukast  10 mg Oral Daily  . Oxcarbazepine  600 mg Oral Daily  . Oxcarbazepine  900 mg Oral QHS   . sodium chloride 50 mL/hr at 05/14/15 1500    ASSESSMENT AND PLAN:  1. Acute renal failure - likely from overdiuresis, responding slowly to IVF - Off lasix (was given mixed messages on to take or not to take lasix) - Off NSAIDs - Off Cozaar - Off metformin - Checked renal U/S normal , and UA no adverse findings - Appreciate  renal team. Hopeful resolution soon with IVF increased to 6275ml/hr. Watch for signs of CHF.   - weight is stable  2. Chronic systolic heart failure - Continue coreg - off ARB secondary to renal issue - stable currently with IVF.   3. Obesity - encourage weight loss  4. Non ischemic cardiomyopathy - also described as ICM,  - remote RCA stent, single vessel disease. - stable, no angina  5. ICD in place- MDT  6. DM - montior  7. Bipolar d/o - meds as above.   8. Dizziness/weakness - coming on gradually - definite contribution from dehydration - check orthostatic VS - PT to see.  Principal Problem:   Acute renal insufficiency Active Problems:   CAD S/P reomte CA PCI 2000, patent Dec 2015   OSA (obstructive sleep apnea)   Obesity   Bipolar disorder   Cardiomyopathy- apparently non ischemic based on cath Dec 2015   S/P MDT ICD Feb 2016   Essential hypertension   Signed, Theodore DemarkBarrett, Rhonda , PA-C 9:03 AM 05/15/2015   I have personally seen and examined this patient with Theodore Demarkhonda Barrett, PA-C. I agree with the assessment and plan as outlined above. Renal function is improving while holding ARB and Lasix. Likely due to volume depletion. His volume status is ok today. Still on IVF. Will continue follow renal function.   MCALHANY,CHRISTOPHER /05/15/2015 9:51 AM

## 2015-05-15 NOTE — Progress Notes (Signed)
Patient ID: Leonides SakeJoseph Casagrande, male   DOB: Aug 30, 1971, 44 y.o.   MRN: [id number] S:Feels better, no dizziness/lightheadedness or syncope O:BP 136/73 mmHg  Pulse 85  Temp(Src) 98.4 F (36.9 C) (Oral)  Resp 18  Ht 6\' 1"  (1.854 m)  Wt 130.681 kg (288 lb 1.6 oz)  BMI 38.02 kg/m2  SpO2 97%  Intake/Output Summary (Last 24 hours) at 05/15/15 1011 Last data filed at 05/15/15 [id number]  Gross per 24 hour  Intake 1652.5 ml  Output   4025 ml  Net -2372.5 ml   Intake/Output: I/O last 3 completed shifts: In: 1642.5 [P.O.:1320; I.V.:322.5] Out: 4400 [Urine:4400]  Intake/Output this shift:  Total I/O In: 370 [P.O.:370] Out: 1125 [Urine:1125] Weight change: -0.59 kg (-1 lb 4.8 oz) Gen:WD obese WM in NAD CVS:no rub Resp:CTA WUX:LKGMWAbd:obese Ext:no edema   Recent Labs Lab 05/12/15 1047 05/12/15 1540 05/13/15 0300 05/13/15 2050 05/14/15 0311 05/15/15 0411  NA 137 135 136 140 142 143  K 3.7 4.1 3.6 4.0 3.6 3.9  CL 100 103 102 105 108 111  CO2 24 20* 24 26 24 23   GLUCOSE 116* 81 104* 120* 99 121*  BUN 26* 27* 26* 25* 25* 23*  CREATININE 4.54* 4.79* 4.39* 3.86* 3.45* 2.47*  CALCIUM 9.4 9.0 8.5* 8.7* 8.6* 8.4*   Liver Function Tests: No results for input(s): AST, ALT, ALKPHOS, BILITOT, PROT, ALBUMIN in the last 168 hours. No results for input(s): LIPASE, AMYLASE in the last 168 hours. No results for input(s): AMMONIA in the last 168 hours. CBC:  Recent Labs Lab 05/12/15 1540  WBC 9.2  HGB 11.5*  HCT 33.2*  MCV 89.5  PLT 199   Cardiac Enzymes: No results for input(s): CKTOTAL, CKMB, CKMBINDEX, TROPONINI in the last 168 hours. CBG:  Recent Labs Lab 05/14/15 0642 05/14/15 1105 05/14/15 1625 05/15/15 0022 05/15/15 0728  GLUCAP 90 86 78 108* 83    Iron Studies: No results for input(s): IRON, TIBC, TRANSFERRIN, FERRITIN in the last 72 hours. Studies/Results: [facility]  05/13/2015   CLINICAL DATA:  44 year old male with acute renal failure.  EXAM: RENAL / URINARY TRACT ULTRASOUND  COMPLETE  COMPARISON:  None.  FINDINGS: Right Kidney:  Length: 13.8 cm. Echogenicity within normal limits. No mass or hydronephrosis visualized.  Left Kidney:  Length: 13.9 cm. Echogenicity within normal limits. No mass or hydronephrosis visualized.  Bladder:  Appears normal for degree of bladder distention.  IMPRESSION: Normal renal ultrasound.   Electronically Signed   By: Harmon PierJeffrey  Hu M.D.   On: 05/13/2015 20:34   . aspirin EC  81 mg Oral Daily  . atorvastatin  40 mg Oral q1800  . clonazePAM  1 mg Oral TID  . gabapentin  300 mg Oral BID  . heparin  5,000 Units Subcutaneous 3 times per day  . insulin aspart  0-15 Units Subcutaneous TID WC  . insulin aspart  0-5 Units Subcutaneous QHS  . lamoTRIgine  200 mg Oral QHS  . loratadine  10 mg Oral Daily  . montelukast  10 mg Oral Daily  . Oxcarbazepine  600 mg Oral Daily  . Oxcarbazepine  900 mg Oral QHS    BMET    Component Value Date/Time   NA 143 05/15/2015 0411   K 3.9 05/15/2015 0411   CL 111 05/15/2015 0411   CO2 23 05/15/2015 0411   GLUCOSE 121* 05/15/2015 0411   BUN 23* 05/15/2015 0411   CREATININE 2.47* 05/15/2015 0411   CREATININE 1.11 04/06/2015 1034   CALCIUM 8.4* 05/15/2015  0411   GFRNONAA 30* 05/15/2015 0411   GFRNONAA >89 10/25/2014 1351   GFRAA 34* 05/15/2015 0411   GFRAA >89 10/25/2014 1351   CBC    Component Value Date/Time   WBC 9.2 05/12/2015 1540   RBC 3.71* 05/12/2015 1540   HGB 11.5* 05/12/2015 1540   HCT 33.2* 05/12/2015 1540   PLT 199 05/12/2015 1540   MCV 89.5 05/12/2015 1540   MCH 31.0 05/12/2015 1540   MCHC 34.6 05/12/2015 1540   RDW 14.9 05/12/2015 1540   LYMPHSABS 1.9 04/20/2015 1527   MONOABS 0.6 04/20/2015 1527   EOSABS 0.2 04/20/2015 1527   BASOSABS 0.0 04/20/2015 1527   1.  Assessment/Plan:  1. AKI- nonoliguric with steady improvement in Scr over the last 3 days.  Will stop IVF's today at 6pm and follow.  Will need close outpt follow up with Cardiology regarding resumption of diuretics  but would avoid ARB/ACE.  If Scr continues to improve should be okay for discharge and f/u with heart failure clinic.  2. CMP- s/p ICD. Stable 3. CAD s/p PCI 4. DM- per primary svc 5. OSA/obesity- per primary 6. HTN- stable   Seif Teichert,Jackie A

## 2015-05-15 NOTE — Progress Notes (Signed)
Placed pt on his home cpap with 2l o2 bleedin.  No frays on cord or obvious defects noted.  RN aware.

## 2015-05-15 NOTE — Progress Notes (Signed)
Hypoglycemic Event  CBG: 63  Treatment: D50 IV 50 mL  Symptoms: Pale, Shaky, Nervous/irritable and Vision changes  Follow-up CBG: Time: 2251 CBG Result:109  Possible Reasons for Event: Inadequate meal intake  Comments/MD notified: Turner, T.                                         Okay to give 1amp D50     Mehgan Santmyer L  Remember to initiate Hypoglycemia Order Set & complete

## 2015-05-16 ENCOUNTER — Telehealth: Payer: Self-pay | Admitting: Nurse Practitioner

## 2015-05-16 ENCOUNTER — Encounter: Payer: Self-pay | Admitting: Nurse Practitioner

## 2015-05-16 DIAGNOSIS — I5023 Acute on chronic systolic (congestive) heart failure: Secondary | ICD-10-CM

## 2015-05-16 LAB — GLUCOSE, CAPILLARY
GLUCOSE-CAPILLARY: 127 mg/dL — AB (ref 65–99)
GLUCOSE-CAPILLARY: 119 mg/dL — AB (ref 65–99)
Glucose-Capillary: 124 mg/dL — ABNORMAL HIGH (ref 65–99)
Glucose-Capillary: 77 mg/dL (ref 65–99)
Glucose-Capillary: 79 mg/dL (ref 65–99)
Glucose-Capillary: 87 mg/dL (ref 65–99)
Glucose-Capillary: 87 mg/dL (ref 65–99)

## 2015-05-16 LAB — BASIC METABOLIC PANEL
Anion gap: 10 (ref 5–15)
BUN: 19 mg/dL (ref 6–20)
CO2: 23 mmol/L (ref 22–32)
Calcium: 8.5 mg/dL — ABNORMAL LOW (ref 8.9–10.3)
Chloride: 110 mmol/L (ref 101–111)
Creatinine, Ser: 2.4 mg/dL — ABNORMAL HIGH (ref 0.61–1.24)
GFR calc Af Amer: 36 mL/min — ABNORMAL LOW (ref >60)
GFR calc non Af Amer: 31 mL/min — ABNORMAL LOW (ref >60)
Glucose, Bld: 96 mg/dL (ref 65–99)
Potassium: 4.2 mmol/L (ref 3.5–5.1)
Sodium: 143 mmol/L (ref 135–145)

## 2015-05-16 MED ORDER — FUROSEMIDE 20 MG PO TABS
20.0000 mg | ORAL_TABLET | Freq: Two times a day (BID) | ORAL | Status: DC
  Administered 2015-05-16 – 2015-05-17 (×3): 20 mg via ORAL
  Filled 2015-05-16 (×5): qty 1

## 2015-05-16 MED ORDER — CARVEDILOL 3.125 MG PO TABS
3.1250 mg | ORAL_TABLET | Freq: Two times a day (BID) | ORAL | Status: DC
  Administered 2015-05-16 (×2): 3.125 mg via ORAL
  Filled 2015-05-16 (×3): qty 1

## 2015-05-16 NOTE — Progress Notes (Signed)
Pt. Broke his home cpap mask had to replace with one of ours.. Pt also wheezing gave another PRN Neb [place].

## 2015-05-16 NOTE — Progress Notes (Signed)
Pt stated that he wears his cpap and that he will put it on before he goes to bed.

## 2015-05-16 NOTE — Progress Notes (Signed)
     SUBJECTIVE: SOB this am with walking. Wheezing and "asthma attack" last night. No chest pain.   BP 110/57 mmHg  Pulse 99  Temp(Src) 98.7 F (37.1 C) (Oral)  Resp 21  Ht 6\' 1"  (1.854 m)  Wt 290 lb (131.543 kg)  BMI 38.27 kg/m2  SpO2 92%  Intake/Output Summary (Last 24 hours) at 05/16/15 [id number] Last data filed at 05/16/15 [id number]  Gross per 24 hour  Intake   3109 ml  Output   2725 ml  Net    384 ml    PHYSICAL EXAM General: Well developed, well nourished, in no acute distress. Alert and oriented x 3.  Psych:  Good affect, responds appropriately Neck: + JVD. No masses noted.  Lungs: Clear bilaterally with scattered wheezes or rhonci noted.  Heart: RRR with no murmurs noted. Abdomen: Bowel sounds are present. Soft, non-tender.  Extremities: No lower extremity edema.   LABS: Basic Metabolic Panel:  Recent Labs  54/07/8106/11/16 0411 05/16/15 0346  NA 143 143  K 3.9 4.2  CL 111 110  CO2 23 23  GLUCOSE 121* 96  BUN 23* 19  CREATININE 2.47* 2.40*  CALCIUM 8.4* 8.5*   Current Meds: . aspirin EC  81 mg Oral Daily  . atorvastatin  40 mg Oral q1800  . clonazePAM  1 mg Oral TID  . dextrose      . gabapentin  300 mg Oral BID  . heparin  5,000 Units Subcutaneous 3 times per day  . insulin aspart  0-15 Units Subcutaneous TID WC  . insulin aspart  0-5 Units Subcutaneous QHS  . lamoTRIgine  200 mg Oral QHS  . loratadine  10 mg Oral Daily  . montelukast  10 mg Oral Daily  . Oxcarbazepine  600 mg Oral Daily  . Oxcarbazepine  900 mg Oral QHS     ASSESSMENT AND PLAN:  1. Acute renal failure: likely from overdiuresis. Appreciate Nephrology consult. He has been off of Lasix and renal function continues to improve. His ARB has been held as an outpatient.   2. Chronic systolic heart failure: Volume status seems to be up today. He has some wheezing and is reporting SOB with ambulation. Weight up slightly. Will ask Nephro about using low dose lasix today (Lasix 20 mg daily) for mild  diuresis. Will need close f/u in the CHF clinic.   3. Non ischemic cardiomyopathy: Will restart low dose Coreg 3.125 mg po BID today. No ARB with recent worsening of renal function.   4. CAD: stable  5. ICD in place     [facility] HospitalMCALHANY,Shailen Thielen  7/12/20169:21 AM

## 2015-05-16 NOTE — Progress Notes (Signed)
Heart Failure Navigator Consult Note  Presentation: Todd Jackson is a pleasant 44 year old male with a history of prior MI and PCI to the right coronary artery. Recently had decreased EF 25% a repeat cardiac catheterization showed no significant obstructive coronary disease. He had a Medtronic ICD placed in February and was placed on appropriate heart failure medicines. This included Lasix which was instructed to be given as needed. He was then seen twice for orthostatic hypotension with decreasing doses of his heart failure medications and according to the notes was instructed to discontinue his Lasix. Apparently he continued to take his Lasix and was [facility] and was given IV fluids. He's continued to feel orthostatic and now laboratory work indicates acute kidney injury with a creatinine of 4.54. He denies any chest pain or worsening shortness of breath. Plan for admission and gentle hydration. We'll hold his metformin and likely consult renal if his renal function does not improve overnight with hydration.  Past Medical History  Diagnosis Date  . CAD (coronary artery disease) 2000    PCI to RCA  . OSA (obstructive sleep apnea)   . Obesity   . Bipolar disorder   . Diverticulitis   . Ischemic cardiomyopathy 10/2014     EF 25% now with ICD- Medtronic  . Acute renal failure 05/12/2015  . Inferior myocardial infarction 2000  . Carotid arterial disease   . ICD (implantable cardioverter-defibrillator) in place 2016    Medtronic    History   Social History  . Marital Status: Married    Spouse Name: N/A  . Number of Children: 2  . Years of Education: N/A   Occupational History  .     Social History Main Topics  . Smoking status: Former Smoker -- 1.00 packs/day for 18 years    Types: Cigarettes    Start date: 02/28/1981    Quit date: 11/04/1998  . Smokeless tobacco: Current User    Types: Snuff  . Alcohol Use: 0.0 oz/week    0 Standard drinks or equivalent  per week     Comment: Rarely  . Drug Use: Not on file  . Sexual Activity: Not on file   Other Topics Concern  . None   Social History Narrative    ECHO:Study Conclusions--05/04/15  - Left ventricle: The cavity size was mildly dilated. Systolic function was moderately to severely reduced. The estimated ejection fraction was in the range of 30% to 35%. Diffuse hypokinesis. There is akinesis of the entireinferolateral and inferior myocardium. Features are consistent with a pseudonormal left ventricular filling pattern, with concomitant abnormal relaxation and increased filling pressure (grade 2 diastolic dysfunction). Doppler parameters are consistent with high ventricular filling pressure. - Pericardium, extracardiac: A trivial pericardial effusion was identified posterior to the heart.  Transthoracic echocardiography. M-mode, complete 2D, spectral Doppler, and color Doppler. Birthdate: Patient birthdate: 1971-07-12. Age: Patient is 44 yr old. Sex: Gender: male. BMI: 37.2 kg/m^2. Blood pressure:   114/77 Patient status: Outpatient. Study date: Study date: 05/04/2015. Study time: 11:40 AM. Location: [facility]  BNP No results found for: BNP  ProBNP    Component Value Date/Time   PROBNP 49.0 05/01/2015 1625     Education Assessment and Provision:  Detailed education and instructions provided on heart failure disease management including the following:  Signs and symptoms of Heart Failure When to call the physician Importance of daily weights Low sodium diet Fluid restriction Medication management Anticipated future follow-up appointments  Patient education given  on each of the above topics.  Patient acknowledges understanding and acceptance of all instructions.  I spoke at length with Mr. Luka regarding his HF.  He tells me has known he's had HF for quite some time and it has recently "gotten worse".  He has a scale and  weighs daily.  He tells me that he is very careful with salt and sodium--and recalls low sodium foods he has been eating at home. He tells me that he has no issue getting or taking medications.  He has a follow-up appt with the [facility] scheduled for 05/29/15 at 11:40am.    Education Materials:  "Living Better With Heart Failure" Booklet, Daily Weight Tracker Tool    High Risk Criteria for Readmission and/or Poor Patient Outcomes:  (Recommend Follow-up with [facility])--yes has follow-up scheduled 05/29/15.   EF <30%-30-35% with grade 2 dias dys  2 or more admissions in 6 months- No  Difficult social situation- No -lives with wife  Demonstrates medication noncompliance- No    Barriers of Care:  Knowledge and compliance  Discharge Planning:  Plans to return home with wife in [place]

## 2015-05-16 NOTE — Progress Notes (Signed)
Patient admitted to hospital 05/12/15

## 2015-05-16 NOTE — Evaluation (Addendum)
Physical Therapy Evaluation Patient Details Name: Todd Jackson MRN: [id number] DOB: 1970/11/18 Today's Date: 05/16/2015   History of Present Illness  44 y.o. year-old with hx of CAD/ PCI, OSA, obesity, bipolar, ICM EF 25%, ICD implant, HTN. He had ICD placed in Feb this year and was given lasix at dc. He had orthostatic symptoms and there were multiple phone calls, an ED visit to an outside hospital, and two recent office visits. In reviewing his phone records he was getting mixed instructions on what to do with his diuretics. He continued to take Lasix and his SCr rose to 4.5 and he was admitted 05/12/15.   Clinical Impression  Pt admitted with above diagnosis. Pt currently with functional limitations due to the deficits listed below (see PT Problem List). Pt ambulates well without LOB.  Was dyspneic with sats 89-91% on RA.  Will follow acutely.   Pt will benefit from skilled PT to increase their independence and safety with mobility to allow discharge to the venue listed below.      Follow Up Recommendations No PT follow up    Equipment Recommendations  None recommended by PT    Recommendations for Other Services       Precautions / Restrictions Precautions Precautions: Fall Restrictions Weight Bearing Restrictions: No      Mobility  Bed Mobility Overal bed mobility: Independent                Transfers Overall transfer level: Independent                  Ambulation/Gait Ambulation/Gait assistance: Supervision Ambulation Distance (Feet): 350 Feet Assistive device: None Gait Pattern/deviations: Step-through pattern;WFL(Within Functional Limits)   Gait velocity interpretation: at or above normal speed for age/gender General Gait Details: No LOB with challenges to balance.  Good cadence.  DOE 3/4 with sats 89-91% on RA.    Stairs            Wheelchair Mobility    Modified Rankin (Stroke Patients Only)       Balance Overall balance assessment:  Needs assistance Sitting-balance support: No upper extremity supported;Feet supported Sitting balance-Leahy Scale: Good     Standing balance support: No upper extremity supported;During functional activity Standing balance-Leahy Scale: Fair Standing balance comment: can stand statically without UE support.  can accept min to mod challenges to balance as well.              High level balance activites: Direction changes;Turns;Sudden stops High Level Balance Comments: all of above with supervision only             Pertinent Vitals/Pain Pain Assessment: No/denies pain  Sats 89-91% on RA. Other VSS   Orthostatic BPs  Supine 126/57, 86 bpm  Sitting 118/63, 90 bpm  Standing 109/60, 87 bpm  Standing after 3 min 110/67, 88 bpm    Home Living Family/patient expects to be discharged to:: Private residence Living Arrangements: Spouse/significant other Available Help at Discharge: Family;Available 24 hours/day (supervision only as wife handicapped) Type of Home: House Home Access: Ramped entrance     Home Layout: One level Home Equipment: None      Prior Function Level of Independence: Independent               Hand Dominance        Extremity/Trunk Assessment   Upper Extremity Assessment: Defer to OT evaluation           Lower Extremity Assessment: Generalized weakness  Cervical / Trunk Assessment: Normal  Communication   Communication: No difficulties  Cognition Arousal/Alertness: Awake/alert Behavior During Therapy: WFL for tasks assessed/performed Overall Cognitive Status: Within Functional Limits for tasks assessed                      General Comments      Exercises        Assessment/Plan    PT Assessment Patient needs continued PT services  PT Diagnosis Generalized weakness   PT Problem List Decreased activity tolerance;Decreased balance;Decreased mobility;Decreased knowledge of use of DME;Decreased safety  awareness;Decreased knowledge of precautions  PT Treatment Interventions Gait training;DME instruction;Functional mobility training;Therapeutic activities;Therapeutic exercise;Balance training;Patient/family education   PT Goals (Current goals can be found in the Care Plan section) Acute Rehab PT Goals Patient Stated Goal: to go home PT Goal Formulation: With patient Time For Goal Achievement: 05/30/15 Potential to Achieve Goals: Good    Frequency Min 3X/week   Barriers to discharge Decreased caregiver support (wife handicapped)      Co-evaluation               End of Session Equipment Utilized During Treatment: Gait belt Activity Tolerance: Patient limited by fatigue Patient left: in chair;with call bell/phone within reach Nurse Communication: Mobility status         Time: 4098-1191 PT Time Calculation (min) (ACUTE ONLY): 17 min   Charges:   PT Evaluation $Initial PT Evaluation Tier I: 1 Procedure     PT G CodesTawni Millers Jackson May 19, 2015, 11:36 AM  Todd Jackson Acute Rehabilitation [phone] (pager)

## 2015-05-16 NOTE — Progress Notes (Signed)
Patient ID: Leonides SakeJoseph Golberg, male   DOB: 1970/12/20, 44 y.o.   MRN: [id number] S:had some SOB last night O:BP 110/57 mmHg  Pulse 99  Temp(Src) 98.7 F (37.1 C) (Oral)  Resp 21  Ht 6\' 1"  (1.854 m)  Wt 131.543 kg (290 lb)  BMI 38.27 kg/m2  SpO2 92%  Intake/Output Summary (Last 24 hours) at 05/16/15 0943 Last data filed at 05/16/15 0824  Gross per 24 hour  Intake   3099 ml  Output   2725 ml  Net    374 ml   Intake/Output: I/O last 3 completed shifts: In: 3589 [P.O.:2389; I.V.:1200] Out: 5750 [Urine:5750]  Intake/Output this shift:  Total I/O In: 360 [P.O.:360] Out: -  Weight change:  Gen:WD obese WM in NAD CVS:no rub Resp:bibasilar crackles WUJ:WJXBJYAbd:benign Ext:tr edema   Recent Labs Lab 05/12/15 1047 05/12/15 1540 05/13/15 0300 05/13/15 2050 05/14/15 0311 05/15/15 0411 05/16/15 0346  NA 137 135 136 140 142 143 143  K 3.7 4.1 3.6 4.0 3.6 3.9 4.2  CL 100 103 102 105 108 111 110  CO2 24 20* 24 26 24 23 23   GLUCOSE 116* 81 104* 120* 99 121* 96  BUN 26* 27* 26* 25* 25* 23* 19  CREATININE 4.54* 4.79* 4.39* 3.86* 3.45* 2.47* 2.40*  CALCIUM 9.4 9.0 8.5* 8.7* 8.6* 8.4* 8.5*   Liver Function Tests: No results for input(s): AST, ALT, ALKPHOS, BILITOT, PROT, ALBUMIN in the last 168 hours. No results for input(s): LIPASE, AMYLASE in the last 168 hours. No results for input(s): AMMONIA in the last 168 hours. CBC:  Recent Labs Lab 05/12/15 1540  WBC 9.2  HGB 11.5*  HCT 33.2*  MCV 89.5  PLT 199   Cardiac Enzymes: No results for input(s): CKTOTAL, CKMB, CKMBINDEX, TROPONINI in the last 168 hours. CBG:  Recent Labs Lab 05/15/15 2251 05/15/15 2326 05/16/15 0014 05/16/15 0146 05/16/15 0550  GLUCAP 109* 106* 79 127* 87    Iron Studies: No results for input(s): IRON, TIBC, TRANSFERRIN, FERRITIN in the last 72 hours. Studies/Results: No results found. Marland Kitchen. aspirin EC  81 mg Oral Daily  . atorvastatin  40 mg Oral q1800  . carvedilol  3.125 mg Oral BID WC  .  clonazePAM  1 mg Oral TID  . dextrose      . gabapentin  300 mg Oral BID  . heparin  5,000 Units Subcutaneous 3 times per day  . insulin aspart  0-15 Units Subcutaneous TID WC  . insulin aspart  0-5 Units Subcutaneous QHS  . lamoTRIgine  200 mg Oral QHS  . loratadine  10 mg Oral Daily  . montelukast  10 mg Oral Daily  . Oxcarbazepine  600 mg Oral Daily  . Oxcarbazepine  900 mg Oral QHS    BMET    Component Value Date/Time   NA 143 05/16/2015 0346   K 4.2 05/16/2015 0346   CL 110 05/16/2015 0346   CO2 23 05/16/2015 0346   GLUCOSE 96 05/16/2015 0346   BUN 19 05/16/2015 0346   CREATININE 2.40* 05/16/2015 0346   CREATININE 1.11 04/06/2015 1034   CALCIUM 8.5* 05/16/2015 0346   GFRNONAA 31* 05/16/2015 0346   GFRNONAA >89 10/25/2014 1351   GFRAA 36* 05/16/2015 0346   GFRAA >89 10/25/2014 1351   CBC    Component Value Date/Time   WBC 9.2 05/12/2015 1540   RBC 3.71* 05/12/2015 1540   HGB 11.5* 05/12/2015 1540   HCT 33.2* 05/12/2015 1540   PLT 199 05/12/2015 1540  MCV 89.5 05/12/2015 1540   MCH 31.0 05/12/2015 1540   MCHC 34.6 05/12/2015 1540   RDW 14.9 05/12/2015 1540   LYMPHSABS 1.9 04/20/2015 1527   MONOABS 0.6 04/20/2015 1527   EOSABS 0.2 04/20/2015 1527   BASOSABS 0.0 04/20/2015 1527     Assessment/Plan:  1. AKI- nonoliguric with steady improvement in Scr over the last 3 days. Will stop IVF's today at 6pm and follow. Will need close outpt follow up with Cardiology regarding resumption of diuretics but would avoid ARB/ACE. If Scr continues to improve should be okay for discharge and f/u with heart failure clinic.  2. CMP- s/p ICD. Has some SOB and edema.  Agree with starting lasix  po daily and recheck labs in am 3. CAD s/p PCI 4. DM- per primary svc 5. OSA/obesity- per primary 6. HTN- stable 7. Dispo- possible discharge tomorrow if stable/improved.  Can follow up with our office in 1 month or with Nephrology in [place].   Leora Platt,Alexy A

## 2015-05-16 NOTE — Telephone Encounter (Signed)
Pt needs note stating when he can return to work- Lawson FiscalLori wrote that note pt is aware and will pick up.

## 2015-05-16 NOTE — Progress Notes (Signed)
PT note Orthostatic vitals as follows: Supine 126/57, 86 bpm  Sitting 118/63, 90 bpm  Standing 109/60, 87 bpm  Standing after 3 min 110/67, 88 bpm   Thanks.  See PT evaluation note for full evaluation.  [facility] ArlingtonDawn Jesselle Laflamme,PT Acute Rehabilitation [phone])

## 2015-05-17 LAB — BASIC METABOLIC PANEL
Anion gap: 10 (ref 5–15)
BUN: 16 mg/dL (ref 6–20)
CO2: 28 mmol/L (ref 22–32)
Calcium: 8.6 mg/dL — ABNORMAL LOW (ref 8.9–10.3)
Chloride: 107 mmol/L (ref 101–111)
Creatinine, Ser: 1.96 mg/dL — ABNORMAL HIGH (ref 0.61–1.24)
GFR calc Af Amer: 45 mL/min — ABNORMAL LOW (ref >60)
GFR calc non Af Amer: 39 mL/min — ABNORMAL LOW (ref >60)
Glucose, Bld: 102 mg/dL — ABNORMAL HIGH (ref 65–99)
Potassium: 3.8 mmol/L (ref 3.5–5.1)
Sodium: 145 mmol/L (ref 135–145)

## 2015-05-17 LAB — GLUCOSE, CAPILLARY
GLUCOSE-CAPILLARY: 104 mg/dL — AB (ref 65–99)
Glucose-Capillary: 103 mg/dL — ABNORMAL HIGH (ref 65–99)

## 2015-05-17 MED ORDER — FUROSEMIDE 20 MG PO TABS: 20.0000 mg | ORAL_TABLET | Freq: Every day | ORAL | Status: DC

## 2015-05-17 MED ORDER — CARVEDILOL 3.125 MG PO TABS
3.1250 mg | ORAL_TABLET | Freq: Two times a day (BID) | ORAL | Status: DC
  Administered 2015-05-17: 3.125 mg via ORAL
  Filled 2015-05-17 (×3): qty 1

## 2015-05-17 MED ORDER — ASPIRIN 81 MG PO TABS: 81.0000 mg | ORAL_TABLET | Freq: Every day | ORAL | Status: AC

## 2015-05-17 MED ORDER — CARVEDILOL 3.125 MG PO TABS: 3.1250 mg | ORAL_TABLET | Freq: Two times a day (BID) | ORAL | Status: DC

## 2015-05-17 NOTE — Progress Notes (Signed)
Patient ID: Todd Jackson, male   DOB: 1971/07/23, 44 y.o.   MRN: [id number] S:no new complaints O:BP 133/80 mmHg  Pulse 83  Temp(Src) 98 F (36.7 C) (Oral)  Resp 20  Ht 6\' 1"  (1.854 m)  Wt 129.366 kg (285 lb 3.2 oz)  BMI 37.64 kg/m2  SpO2 94%  Intake/Output Summary (Last 24 hours) at 05/17/15 0953 Last data filed at 05/17/15 0900  Gross per 24 hour  Intake   2027 ml  Output   4075 ml  Net  -2048 ml   Intake/Output: I/O last 3 completed shifts: In: 2726 [P.O.:2726] Out: 5850 [Urine:5850]  Intake/Output this shift:  Total I/O In: 360 [P.O.:360] Out: -  Weight change: -1.315 kg (-2 lb 14.4 oz) Gen:WD obese WM in NAd CVS:no rub Resp:cta WUJ:WJXBJYAbd:benign Ext:no edema   Recent Labs Lab 05/12/15 1540 05/13/15 0300 05/13/15 2050 05/14/15 0311 05/15/15 0411 05/16/15 0346 05/17/15 0300  NA 135 136 140 142 143 143 145  K 4.1 3.6 4.0 3.6 3.9 4.2 3.8  CL 103 102 105 108 111 110 107  CO2 20* 24 26 24 23 23 28   GLUCOSE 81 104* 120* 99 121* 96 102*  BUN 27* 26* 25* 25* 23* 19 16  CREATININE 4.79* 4.39* 3.86* 3.45* 2.47* 2.40* 1.96*  CALCIUM 9.0 8.5* 8.7* 8.6* 8.4* 8.5* 8.6*   Liver Function Tests: No results for input(s): AST, ALT, ALKPHOS, BILITOT, PROT, ALBUMIN in the last 168 hours. No results for input(s): LIPASE, AMYLASE in the last 168 hours. No results for input(s): AMMONIA in the last 168 hours. CBC:  Recent Labs Lab 05/12/15 1540  WBC 9.2  HGB 11.5*  HCT 33.2*  MCV 89.5  PLT 199   Cardiac Enzymes: No results for input(s): CKTOTAL, CKMB, CKMBINDEX, TROPONINI in the last 168 hours. CBG:  Recent Labs Lab 05/16/15 1153 05/16/15 1642 05/16/15 2057 05/16/15 2211 05/17/15 0609  GLUCAP 77 119* 87 124* 103*    Iron Studies: No results for input(s): IRON, TIBC, TRANSFERRIN, FERRITIN in the last 72 hours. Studies/Results: No results found. Marland Kitchen. aspirin EC  81 mg Oral Daily  . atorvastatin  40 mg Oral q1800  . carvedilol  3.125 mg Oral BID WC  . clonazePAM   1 mg Oral TID  . furosemide  20 mg Oral BID  . gabapentin  300 mg Oral BID  . heparin  5,000 Units Subcutaneous 3 times per day  . insulin aspart  0-15 Units Subcutaneous TID WC  . insulin aspart  0-5 Units Subcutaneous QHS  . lamoTRIgine  200 mg Oral QHS  . loratadine  10 mg Oral Daily  . montelukast  10 mg Oral Daily  . Oxcarbazepine  600 mg Oral Daily  . Oxcarbazepine  900 mg Oral QHS    BMET    Component Value Date/Time   NA 145 05/17/2015 0300   K 3.8 05/17/2015 0300   CL 107 05/17/2015 0300   CO2 28 05/17/2015 0300   GLUCOSE 102* 05/17/2015 0300   BUN 16 05/17/2015 0300   CREATININE 1.96* 05/17/2015 0300   CREATININE 1.11 04/06/2015 1034   CALCIUM 8.6* 05/17/2015 0300   GFRNONAA 39* 05/17/2015 0300   GFRNONAA >89 10/25/2014 1351   GFRAA 45* 05/17/2015 0300   GFRAA >89 10/25/2014 1351   CBC    Component Value Date/Time   WBC 9.2 05/12/2015 1540   RBC 3.71* 05/12/2015 1540   HGB 11.5* 05/12/2015 1540   HCT 33.2* 05/12/2015 1540   PLT 199 05/12/2015 1540  MCV 89.5 05/12/2015 1540   MCH 31.0 05/12/2015 1540   MCHC 34.6 05/12/2015 1540   RDW 14.9 05/12/2015 1540   LYMPHSABS 1.9 04/20/2015 1527   MONOABS 0.6 04/20/2015 1527   EOSABS 0.2 04/20/2015 1527   BASOSABS 0.0 04/20/2015 1527     Assessment/Plan:  1. AKI- nonoliguric with steady improvement in Scr over the last 3 days.Scr continues to improve and should be okay for discharge and f/u with heart failure clinic.  1. Continue with lasix  bid 2. Follow up with our clinic on 06/21/15 at 10:45 am at [address] [place], [place] [address].  Office number [phone]. 2. CMP- s/p ICD. Has some SOB and edema. Agree with starting lasix  po daily and recheck labs in am 3. CAD s/p PCI 4. DM- per primary svc 5. OSA/obesity- per primary 6. HTN- stable 7. Dispo- discharge today.  Ventura Hollenbeck,Buddy A

## 2015-05-17 NOTE — Discharge Summary (Signed)
Physician Discharge Summary  Patient ID: Todd Jackson MRN: [id number] DOB/AGE: 1971-05-30 44 y.o.   Primary Cardiologist: Dr. Jens Som  Admit date: 05/12/2015 Discharge date: 05/17/2015  Admission Diagnoses: Acute Renal Insufficiency  Discharge Diagnoses:  Principal Problem:   Acute renal insufficiency Active Problems:   CAD S/P reomte CA PCI 2000, patent Dec 2015   OSA (obstructive sleep apnea)   Obesity   Bipolar disorder   Cardiomyopathy- apparently non ischemic based on cath Dec 2015   S/P MDT ICD Feb 2016   Essential hypertension   Discharged Condition: stable  Hospital Course: 44 year old male, followed by Dr. Jens Som and Dr. Ladona Ridgel, with a history of prior MI and PCI to the right coronary artery and NICM with no significant obstructive coronary disease and patent RCA stent on cath 10/2014. He had a Medtronic ICD placed in February of this year and was placed on appropriate heart failure regimen. His history is also notable for DM, HTN and OSA. He recently was seen in the outpatient setting for orthostatic hypotension with decreasing doses of his heart failure medications and according to the notes was instructed to discontinue his Lasix. Apparently, he continued to take his Lasix and f/u outpatient labs revealed acute renal insufficiency with SCr up to 4.54. He was instructed to present to the hospital for admission. He was admitted for nonoliguric AKI. He was treated with IV fluids and his ACE-I, diuretics and metformin were held. His Coreg was also held given hypotension.  Nephrology was consulted. He had significant, steady improvement in SCr with conservative management. Prior to discharge, nephology resumed low dose daily lasix, 20 mg, given his chronic CHF and signs of slight volume overload. His renal function was monitored after the restart of lasix and remained stable w/o rise in SCr. His SCr day of discharge had improved to 1.96. Nephrology recommended that his ACE-I  continue to be held. He was restarted on low dose Coreg and tolerated well w/o any further hypotension. His volume status normalized after restarting his diuretic. He was last seen and examined by Dr. Clifton James who determined he was stable for discharge home. He was given orders to obtain f/u BMP to reassess renal function in 5 days on 7/18. He was instructed to keep his appointment with [facility] on 05/29/15.   Consults: nephrology  Significant Diagnostic Studies:   Treatments: See Hospital Course  Discharge Exam: Blood pressure 133/80, pulse 83, temperature 98 F (36.7 C), temperature source Oral, resp. rate 20, height  (1.854 m), weight 285 lb 3.2 oz (129.366 kg), SpO2 94 %.   Disposition: 01-Home or Self Care      Discharge Instructions    Diet - low sodium heart healthy    Complete by:  As directed      Discharge instructions    Complete by:  As directed   You will need to get repeat labs done in 5-7 days to recheck kidney function. Take prescription with you to lab of your choice.     Increase activity slowly    Complete by:  As directed             Medication List    STOP taking these medications        metFORMIN 1000 MG tablet  Commonly known as:  GLUCOPHAGE      TAKE these medications        aspirin 81 MG tablet  Take 1 tablet (81 mg total) by mouth daily.  carvedilol 3.125 MG tablet  Commonly known as:  COREG  Take 1 tablet (3.125 mg total) by mouth 2 (two) times daily with a meal.     clonazePAM 1 MG tablet  Commonly known as:  KLONOPIN  Take 1 mg by mouth 3 (three) times daily.     furosemide 20 MG tablet  Commonly known as:  LASIX  Take 1 tablet (20 mg total) by mouth daily.     gabapentin 300 MG capsule  Commonly known as:  NEURONTIN  Take 300 mg by mouth 2 (two) times daily.     lamoTRIgine 200 MG tablet  Commonly known as:  LAMICTAL  Take 200 mg by mouth at bedtime.     levocetirizine 5 MG tablet  Commonly known as:   XYZAL  Take 10 mg by mouth every evening.     modafinil 200 MG tablet  Commonly known as:  PROVIGIL  Take 400 mg by mouth daily as needed. To keep me going per patient     montelukast 10 MG tablet  Commonly known as:  SINGULAIR  Take 10 mg by mouth daily.     nitroGLYCERIN 0.4 MG SL tablet  Commonly known as:  NITROSTAT  Place 1 tablet (0.4 mg total) under the tongue every 5 (five) minutes as needed for chest pain.     Oxcarbazepine 300 MG tablet  Commonly known as:  TRILEPTAL  Take 300-900 mg by mouth See admin instructions. Take 2 tablet in the AM and 3 at night     PROAIR HFA 108 (90 BASE) MCG/ACT inhaler  Generic drug:  albuterol  Inhale 1-2 puffs into the lungs every 4 (four) hours as needed for wheezing or shortness of breath. Asthma     simvastatin 80 MG tablet  Commonly known as:  ZOCOR  Take 1 tablet (80 mg total) by mouth daily.       Follow-up Information    Follow up with Marca Anconaalton McLean, MD. Go on 05/29/2015.   Specialty:  Cardiology   Why:  at 11:40am in the [facility]--gate code [id number]--please bring all medications to appt   Contact information:   [address] [place] [address] [phone]       TIME SPENT ON DISCHARGE, INCLUDING PHYSICIAN TIME: >30 MINUTES  Signed: Robbie LisSIMMONS, BRITTAINY 05/17/2015, 12:44 PM

## 2015-05-17 NOTE — Progress Notes (Signed)
Discharged to home with family .Wheeled lobby by Psychiatristvolunteer staff

## 2015-05-17 NOTE — Progress Notes (Signed)
Patient Profile: 44 year old male with a history of prior MI and PCI to the right coronary artery, NICM with EF of 30-35% s/p ICD, admitted for ARF with SCr of 4.54 at time of admit.   Subjective: No chest pain. Still with mild DOE but no resting dyspnea, orthopnea/ PND.  Objective: Vital signs in last 24 hours: Temp:  [98 F (36.7 C)-98.2 F (36.8 C)] 98 F (36.7 C) (07/13 0455) Pulse Rate:  [66-83] 83 (07/13 0455) Resp:  [18-20] 20 (07/13 0455) BP: (131-144)/(79-87) 133/80 mmHg (07/13 0455) SpO2:  [94 %-100 %] 94 % (07/13 0455) Weight:  [285 lb 3.2 oz (129.366 kg)] 285 lb 3.2 oz (129.366 kg) (07/13 0455) Last BM Date: 05/15/15  Intake/Output from previous day: 07/12 0701 - 07/13 0700 In: 2027 [P.O.:2027] Out: 4075 [Urine:4075] Intake/Output this shift:    Medications Current Facility-Administered Medications  Medication Dose Route Frequency Provider Last Rate Last Dose  . acetaminophen (TYLENOL) tablet 650 mg  650 mg Oral Q4H PRN Eda PaschalLuke K Kilroy, PA-C      . albuterol (PROVENTIL) (2.5 MG/3ML) 0.083% nebulizer solution 2.5 mg  2.5 mg Nebulization Q4H PRN Lewayne BuntingBrian S Crenshaw, MD   2.5 mg at 05/16/15 1630  . ALPRAZolam Prudy Feeler(XANAX) tablet 0.25 mg  0.25 mg Oral BID PRN Abelino DerrickLuke K Kilroy, PA-C      . aspirin EC tablet 81 mg  81 mg Oral Daily Abelino DerrickLuke K Kilroy, PA-C   81 mg at 05/16/15 1008  . atorvastatin (LIPITOR) tablet 40 mg  40 mg Oral q1800 Abelino DerrickLuke K Kilroy, PA-C   40 mg at 05/16/15 1708  . carvedilol (COREG) tablet 3.125 mg  3.125 mg Oral BID WC Lewayne BuntingBrian S Crenshaw, MD      . clonazePAM Scarlette Calico(KLONOPIN) tablet 1 mg  1 mg Oral TID Abelino DerrickLuke K Kilroy, PA-C   1 mg at 05/16/15 2111  . furosemide (LASIX) tablet 20 mg  20 mg Oral BID Kathleene Hazelhristopher D Hally Colella, MD   20 mg at 05/16/15 1708  . gabapentin (NEURONTIN) capsule 300 mg  300 mg Oral BID Abelino DerrickLuke K Kilroy, PA-C   300 mg at 05/16/15 2111  . heparin injection 5,000 Units  5,000 Units Subcutaneous 3 times per day Abelino DerrickLuke K Kilroy, PA-C   5,000 Units at 05/17/15 0543   . insulin aspart (novoLOG) injection 0-15 Units  0-15 Units Subcutaneous TID WC Abelino DerrickLuke K Kilroy, PA-C   0 Units at 05/13/15 0800  . insulin aspart (novoLOG) injection 0-5 Units  0-5 Units Subcutaneous QHS Abelino DerrickLuke K Kilroy, PA-C   0 Units at 05/12/15 2314  . lamoTRIgine (LAMICTAL) tablet 200 mg  200 mg Oral QHS Eda PaschalLuke K SmithfieldKilroy, PA-C   200 mg at 05/16/15 2111  . loratadine (CLARITIN) tablet 10 mg  10 mg Oral Daily Lewayne BuntingBrian S Crenshaw, MD   10 mg at 05/16/15 1007  . montelukast (SINGULAIR) tablet 10 mg  10 mg Oral Daily Abelino DerrickLuke K Kilroy, PA-C   10 mg at 05/16/15 1010  . nitroGLYCERIN (NITROSTAT) SL tablet 0.4 mg  0.4 mg Sublingual Q5 Min x 3 PRN Luke K Kilroy, PA-C      . ondansetron [facility](ZOFRAN) injection 4 mg  4 mg Intravenous Q6H PRN Abelino DerrickLuke K Kilroy, PA-C      . Oxcarbazepine (TRILEPTAL) tablet 600 mg  600 mg Oral Daily Lewayne BuntingBrian S Crenshaw, MD   600 mg at 05/16/15 1008  . Oxcarbazepine (TRILEPTAL) tablet 900 mg  900 mg Oral QHS Luke K Kilroy, PA-C   900 mg at  05/16/15 2111  . zolpidem (AMBIEN) tablet 5 mg  5 mg Oral QHS PRN,MR X 1 Abelino Derrick, PA-C        PE: General appearance: alert, cooperative and no distress Neck: no carotid bruit and no JVD Lungs: clear to auscultation bilaterally Heart: regular rate and rhythm, S1, S2 normal, no murmur, click, rub or gallop Extremities: no LEE Pulses: 2+ and symmetric Skin: warm and dry Neurologic: Grossly normal  Lab Results:  No results for input(s): WBC, HGB, HCT, PLT in the last 72 hours. BMET  Recent Labs  05/15/15 0411 05/16/15 0346 05/17/15 0300  NA 143 143 145  K 3.9 4.2 3.8  CL 111 110 107  CO2 GLUCOSE 121* 96 102*  BUN 23* 19 16  CREATININE 2.47* 2.40* 1.96*  CALCIUM 8.4* 8.5* 8.6*     Assessment/Plan  Principal Problem:   Acute renal insufficiency Active Problems:   CAD S/P reomte CA PCI 2000, patent Dec 2015   OSA (obstructive sleep apnea)   Obesity   Bipolar disorder   Cardiomyopathy- apparently non ischemic based on  cath Dec 2015   S/P MDT ICD Feb 2016   Essential hypertension   1. ARF: SCr continues to improve daily. Scr on admit was 4.54. Now at 1.96 (2.40 yesterday). Per nephrology, continue to hold ACE/ARB. Low dose lasix was added yesterday by nephrology, 20 mg daily. No worsening renal function.  2. Chronic Systolic CHF: volume status appears stable after restart of Lasix yesterday. Good diuresis over the last 24 hrs. -4L out. Net I/Os since admit -7L. Continue daily lasix, BB, low sodium diet and daily weights.   3. 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 Continue low dose Coreg. HR and BP both stable. No ACE/ARB therapy given abnormal renal function. ICD in place for primary prevention of SCD.   4. CAD: stable w/o CP.   Dispo: Possible d/c home today with close f/u with outpatient BMP and CHF f/u.    LOS: 5 days    Brittainy M. Sharol Harness, PA-C 05/17/2015 9:00 AM  I have personally seen and examined this patient with Robbie Lis, PA-C. I agree with the assessment and plan as outlined above. His renal function is stable. Will d/c home on Lasix 20 mg once daily. Follow up in CHF clinic as planned. Nephro has set up f/u in August. He will need BMET in 5-7 days.   Zaliah Wissner 05/17/2015 9:58 AM

## 2015-05-18 ENCOUNTER — Telehealth: Payer: Self-pay | Admitting: Nurse Practitioner

## 2015-05-18 ENCOUNTER — Other Ambulatory Visit: Payer: Self-pay | Admitting: Cardiology

## 2015-05-18 NOTE — Telephone Encounter (Signed)
Follow UP   Pt calling to speak to RN about the documents that he sent in to be filled out for his short term disability and  He need to pick them up by tomorrow for work  Please give him a call

## 2015-05-18 NOTE — Telephone Encounter (Signed)
Walk in pt form-[employer] Statement-[facility]

## 2015-05-19 ENCOUNTER — Telehealth: Payer: Self-pay | Admitting: Cardiology

## 2015-05-19 ENCOUNTER — Telehealth: Payer: Self-pay

## 2015-05-19 ENCOUNTER — Telehealth (HOSPITAL_COMMUNITY): Payer: Self-pay | Admitting: Surgery

## 2015-05-19 NOTE — Telephone Encounter (Signed)
Heart Failure Nurse Navigator Post Discharge Telephone Call  I called Mr. [name] this am and left a message for them to call back with any concerns or questions related to his HF.  I received a return call from Todd Jackson.  She was mainly concerned about his FMLA paperwork which looks to be handled by [facility].  She tells me that his weight is down today to 284lbs from 285lbs day of discharge from hosp.  She says he has all medications and is taking them as prescribed.  She also reports that they are eating low sodium.  She complains that he has been "struggling with low blood sugars".  I asked her to relay that information to his PCP.  I encouraged her to call me back with any concerns or questions related to his HF.

## 2015-05-19 NOTE — Telephone Encounter (Signed)
New message      Pt's wife checking on status of short term disability paperwork.  The paperwork was to be filled out by Norma FredricksonLori Gerhardt. Paperwork was dropped off on Wednesday, this is second set of paperwork that has been dropped off. First set of paperwork was filled out incorrectly   Please call to discuss.

## 2015-05-19 NOTE — Telephone Encounter (Signed)
Per pt's conversation pt stated dropped off paperwork Wednesday and needs this paperwork filled out.  Pt stated Dr. Jens Somrenshaw has done this twice and never had any problem before.  I stated the letter Lawson FiscalLori wrote went to medical records and the Selena BattenKim who was handling this is out today.  Pt requested to speak with office manager.  Barth Kirkseri is aware and will be calling pt.

## 2015-05-19 NOTE — Telephone Encounter (Signed)
Pt calling again,he is waiting to hear something,

## 2015-05-22 ENCOUNTER — Other Ambulatory Visit: Payer: Self-pay | Admitting: Cardiology

## 2015-05-22 ENCOUNTER — Telehealth: Payer: Self-pay | Admitting: Cardiology

## 2015-05-22 LAB — BASIC METABOLIC PANEL WITH GFR
BUN: 11 mg/dL (ref 6–23)
CHLORIDE: 102 meq/L (ref 96–112)
CO2: 26 mEq/L (ref 19–32)
Calcium: 9.5 mg/dL (ref 8.4–10.5)
Creat: 1.47 mg/dL — ABNORMAL HIGH (ref 0.50–1.35)
GFR, EST AFRICAN AMERICAN: 65 mL/min
GFR, EST NON AFRICAN AMERICAN: 56 mL/min — AB
Glucose, Bld: 213 mg/dL — ABNORMAL HIGH (ref 70–99)
Potassium: 3.7 mEq/L (ref 3.5–5.3)
Sodium: 142 mEq/L (ref 135–145)

## 2015-05-22 NOTE — Telephone Encounter (Signed)
Lab called for ICD-10 code for BMP - this was given. No further inquiries.

## 2015-05-25 ENCOUNTER — Telehealth: Payer: Self-pay | Admitting: *Deleted

## 2015-05-25 ENCOUNTER — Ambulatory Visit (INDEPENDENT_AMBULATORY_CARE_PROVIDER_SITE_OTHER): Payer: BLUE CROSS/BLUE SHIELD | Admitting: *Deleted

## 2015-05-25 DIAGNOSIS — Z95810 Presence of automatic (implantable) cardiac defibrillator: Secondary | ICD-10-CM | POA: Diagnosis not present

## 2015-05-25 DIAGNOSIS — I5022 Chronic systolic (congestive) heart failure: Secondary | ICD-10-CM

## 2015-05-25 NOTE — Progress Notes (Addendum)
EPIC Encounter for ICM Monitoring  Patient Name: Todd Jackson is a 44 y.o. male Date: 05/25/2015 Primary Care Physican: PROVIDER NOT IN SYSTEM Primary Cardiologist: Crenshaw Electrophysiologist: Ladona Ridgel Dry Weight: 280 lbs       In the past month, have you:  1. Gained more than 2 pounds in a day or more than 5 pounds in a week? No. Per the patient's wife, his weight is down to 278-279 lbs over the last few days.  2. Had changes in your medications (with verification of current medications)? The patient has been seen in the office a few times over the last few weeks for hypotension. He has had several adjustments to his medications. At the present time he is taking coreg 3.125 mg BID and his wife, who I spoke with today, states she thinks he is taking his lasix 20 mg once daily.   3. Had more shortness of breath than is usual for you? no  4. Limited your activity because of shortness of breath? no  5. Not been able to sleep because of shortness of breath? no  6. Had increased swelling in your feet or ankles? no  7. Had symptoms of dehydration (dizziness, dry mouth, increased thirst, decreased urine output) See below.  8. Had changes in sodium restriction? no  9. Been compliant with medication? Yes   ICM trend:   Follow-up plan: ICM clinic phone appointment: 07/06/15. This is my first ICM encounter with the patient. In reviewing his history, he has had 3 office visits with the PA/ NP to follow up on low BP over the last few weeks. He was admitted to the ER- 05/12/15 with acute renal insufficiency- creatinine 4.54. He was admitted for hydration. He was discharged on lasix 20 mg once daily. Optivol readings have been showing some elevation since late May, but impedence appears to be returning to baseline at this time. Per the patient's wife, his BP has been running ~ 110/70 since discharge on 7/13. However, he did fall twice last night with SBP readings in the 80's. He did not pass out, but  was extremely dizzy. He has been using his wife's cane at times for support. He is not having any swelling at the present time, but breathing is somewhat labored with walking. He is due to follow up with the CHF clinic on 05/29/15. I advised the patient's wife I would forward to Dr. Jens Som to review/ for recommendations for any medication changes prior to follow up with the CHF clinic next week. I will call the patient's wife back no later than tomorrow. She is agreeable.   Copy of note sent to patient's primary care physician, primary cardiologist, and device following physician.  Sherri Rad, RN, BSN 05/25/2015 4:37 PM  Told debra to hold coreg for now given low BP and fu chf clinic  Olga Millers   I called and notified the patient's wife to hold carvedilol over the weekend until seen by the CHF clinic.

## 2015-05-25 NOTE — Telephone Encounter (Signed)
I spoke with the patient. 

## 2015-05-25 NOTE — Telephone Encounter (Signed)
ICM transmission received. I left a message for the patient to call. 

## 2015-05-26 ENCOUNTER — Telehealth: Payer: Self-pay | Admitting: Cardiology

## 2015-05-26 NOTE — Telephone Encounter (Signed)
I spoke with the patient's wife yesterday for ICM follow up - see ICM note dated 05/25/15. The patient is having low BP's and falls. Per Dr. Jens Som, ok to hold coreg until seen by the CHF clinic on Monday. The patient's wife is aware.

## 2015-05-29 ENCOUNTER — Encounter (HOSPITAL_COMMUNITY): Payer: Self-pay

## 2015-05-29 ENCOUNTER — Other Ambulatory Visit (HOSPITAL_COMMUNITY): Payer: Self-pay

## 2015-05-29 ENCOUNTER — Encounter (HOSPITAL_COMMUNITY): Payer: Self-pay | Admitting: *Deleted

## 2015-05-29 ENCOUNTER — Ambulatory Visit (HOSPITAL_COMMUNITY)
Admission: RE | Admit: 2015-05-29 | Discharge: 2015-05-29 | Disposition: A | Discharge status: Home / Self Care | Payer: BLUE CROSS/BLUE SHIELD | Source: Ambulatory Visit | Attending: Internal Medicine | Admitting: Internal Medicine

## 2015-05-29 VITALS — BP 124/72 | HR 96 | Wt 282.8 lb

## 2015-05-29 DIAGNOSIS — I5022 Chronic systolic (congestive) heart failure: Secondary | ICD-10-CM

## 2015-05-29 DIAGNOSIS — I255 Ischemic cardiomyopathy: Secondary | ICD-10-CM | POA: Diagnosis not present

## 2015-05-29 DIAGNOSIS — E785 Hyperlipidemia, unspecified: Secondary | ICD-10-CM | POA: Diagnosis not present

## 2015-05-29 DIAGNOSIS — I2583 Coronary atherosclerosis due to lipid rich plaque: Secondary | ICD-10-CM

## 2015-05-29 DIAGNOSIS — I2510 Atherosclerotic heart disease of native coronary artery without angina pectoris: Secondary | ICD-10-CM | POA: Diagnosis not present

## 2015-05-29 DIAGNOSIS — I951 Orthostatic hypotension: Secondary | ICD-10-CM

## 2015-05-29 LAB — BASIC METABOLIC PANEL
Anion gap: 11 (ref 5–15)
BUN: 11 mg/dL (ref 6–20)
CO2: 25 mmol/L (ref 22–32)
Calcium: 9.7 mg/dL (ref 8.9–10.3)
Chloride: 102 mmol/L (ref 101–111)
Creatinine, Ser: 1.31 mg/dL — ABNORMAL HIGH (ref 0.61–1.24)
GFR calc Af Amer: >60 mL/min (ref >60)
Glucose, Bld: 127 mg/dL — ABNORMAL HIGH (ref 65–99)
Potassium: 3.7 mmol/L (ref 3.5–5.1)
SODIUM: 138 mmol/L (ref 135–145)

## 2015-05-29 LAB — BRAIN NATRIURETIC PEPTIDE: B Natriuretic Peptide: 68.5 pg/mL (ref 0.0–100.0)

## 2015-05-29 MED ORDER — MIDODRINE HCL 2.5 MG PO TABS: 2.5000 mg | ORAL_TABLET | Freq: Three times a day (TID) | ORAL | Status: DC

## 2015-05-29 NOTE — Progress Notes (Signed)
Patient ID: Todd Jackson, male   DOB: 10/24/1971, 44 y.o.   MRN: [id number]  Referring Physician: Lori Gerhardt PCP: Cathy Brown Primary Cardiologist: Crenshaw/ Taylor/  Primary HF: Gurtaj Ruz  HPI:  Todd Jackson is a 44 y.o. male with Chronic combined CHF (Echo 05/04/15 30-35% Gr 2 DD), Ischemic cardiomyopathy, CAD s/p PCI with stenting of RCA in 2000, CKD, OSA, Obesity, Bipolar disorder, DM2 (diagnosed 1 year ago). EF was 50% via cath in 2000.  Echo 10/2014 showed EF 25%, moderate LAE and mild MR.  Cath 12/15 showed no significant CAD and EF 25%.   Had Medtronic ICV with Optivol implanted 2/16.  Here with his wife. Says he started feeling poorly in January. Falling and feeling weak. Found to be orthostatic. Has had multiple med adjustments recently. Spironolactone and losartan stopped completely at end of June.  Was admitted on 7/8 with AKI. Creatinine ~1.0 -> 4.5. Carvedilol restarted at low dose but stopped over the weekend for dizziness. He reports today to establish in HF clinic referred by Lori Gerhardt. Starting in the middle of June he has had trouble with low blood pressure and lack of energy. Has complaints of dizziness with walking and lightheadedness that has led to 10-12 falls since January. Has had near syncopal events. Denies room spinning, but says in midst of episode it feels like the floor is coming up at him. Was taking off all of his cardiac meds after a hospitalization. Has days he can walk through [employer] without SOB, and other days he can't even walk 20 ft without SOB. Does occasionally have DOE at rest. Denies edema, says his belly does get big. He has OSA with CPAP but complains of significant daytime fatigue and feels constantly tired and weak.  Takes provigil to help him stay awake.  Denies any illness or changes in diet.  Has been eating less.  Eating a low salt diet. Says he drinks 4 16 oz bottles + 2-3 sodas per day. Takes BP at home frequently SBP 80-120. Diagnosed with  DM2 about 1 year ago but has recently been diagnosed with peripheral neuropathy.   ECHO 05/04/15 EF 30-55%, Grade 2 DD, Diffuse hypokinesis with akinesis of entire inferolateral/inferior myocardium. Trivial MR  LHC 10/13/14 LM: Widely patent. LAD: widely patent. LCx: There is only mild disease in the circumflex system. RCA: The stent in the mid vessel is widely patent. There is mild disease in the distal RCA.  EF: 25%, inferior akinesis. LVEDP was 32 mmHg.  Carotid US 11/29/08 Bilateral ICA < 50%   Review of Systems: [y] = yes, [ ] = no   General: Weight gain [ ]; Weight loss [y]; Anorexia [ ]; Fatigue [y]; Fever [ ]; Chills [ ]; Weakness [ ]  Cardiac: Chest pain/pressure [y]; Resting SOB [y]; Exertional SOB [y]; Orthopnea [y]; Pedal Edema [ ]; Palpitations [y]; Syncope [y ]; Presyncope [y]; Paroxysmal nocturnal dyspnea[ ]  Pulmonary: Cough [y]; Wheezing[ ]; Hemoptysis[ ]; Sputum [ ]; Snoring [ ]  GI: Vomiting[ ]; Dysphagia[ ]; Melena[ ]; Hematochezia [ ]; Heartburn[ ]; Abdominal pain [ ]; Constipation [ ]; Diarrhea [ ]; BRBPR [ ]  GU: Hematuria[ ]; Dysuria [ ]; Nocturia[ ]  Vascular: Pain in legs with walking [ ]; Pain in feet with lying flat [ ]; Non-healing sores [ ]; Stroke [ ]; TIA [ ]; Slurred speech [ ];  Neuro: Headaches[ ]; Vertigo[ ]; Seizures[ ]; Paresthesias[ ];Blurred vision [ ]; Diplopia [ ]; Vision changes [ ]  Ortho/Skin:   Arthritis [y ]; Joint pain [y ]; Muscle pain [ ]; Joint swelling [ ]; Back Pain [y]; Rash [ ]  Psych: Depression[ ]; Anxiety[ ]  Heme: Bleeding problems [ ]; Clotting disorders [ ]; Anemia [ ]  Endocrine: Diabetes [y ]; Thyroid dysfunction[ ]   Past Medical History  Diagnosis Date  . CAD (coronary artery disease) 2000    PCI to RCA  . OSA (obstructive sleep apnea)   . Obesity   . Bipolar disorder   . Diverticulitis   . Ischemic cardiomyopathy 10/2014     EF 25% now with ICD- Medtronic  . Acute renal failure 05/12/2015  . Inferior myocardial  infarction 2000  . Carotid arterial disease   . ICD (implantable cardioverter-defibrillator) in place 2016    Medtronic    Current Outpatient Prescriptions  Medication Sig Dispense Refill  . aspirin 81 MG tablet Take 1 tablet (81 mg total) by mouth daily.    . clonazePAM (KLONOPIN) 1 MG tablet Take 1 mg by mouth 3 (three) times daily.     . furosemide (LASIX) 20 MG tablet Take 1 tablet (20 mg total) by mouth daily. 30 tablet 5  . gabapentin (NEURONTIN) 300 MG capsule Take 300 mg by mouth 2 (two) times daily.    . lamoTRIgine (LAMICTAL) 200 MG tablet Take 200 mg by mouth at bedtime.     . levocetirizine (XYZAL) 5 MG tablet Take 10 mg by mouth every evening.    . modafinil (PROVIGIL) 200 MG tablet Take 400 mg by mouth daily as needed. To keep me going per patient    . montelukast (SINGULAIR) 10 MG tablet Take 10 mg by mouth daily.     . Oxcarbazepine (TRILEPTAL) 300 MG tablet Take 300-900 mg by mouth See admin instructions. Take 2 tablet in the AM and 3 at night  0  . PROAIR HFA 108 (90 BASE) MCG/ACT inhaler Inhale 1-2 puffs into the lungs every 4 (four) hours as needed for wheezing or shortness of breath. Asthma  1  . simvastatin (ZOCOR) 80 MG tablet Take 1 tablet (80 mg total) by mouth daily. 30 tablet 11  . nitroGLYCERIN (NITROSTAT) 0.4 MG SL tablet Place 1 tablet (0.4 mg total) under the tongue every 5 (five) minutes as needed for chest pain. (Patient not taking: Reported on 05/29/2015) 25 tablet 11   No current facility-administered medications for this encounter.    No Known Allergies    History   Social History  . Marital Status: Married    Spouse Name: N/A  . Number of Children: 2  . Years of Education: N/A   Occupational History  .     Social History Main Topics  . Smoking status: Former Smoker -- 1.00 packs/day for 18 years    Types: Cigarettes    Start date: 02/28/1981    Quit date: 11/04/1998  . Smokeless tobacco: Current User    Types: Snuff  . Alcohol Use: 0.0  oz/week    0 Standard drinks or equivalent per week     Comment: Rarely  . Drug Use: Not on file  . Sexual Activity: Not on file   Other Topics Concern  . Not on file   Social History Narrative      Family History  Problem Relation Age of Onset  . CAD Mother     MI at age 40  . Alcohol abuse Mother   . Colon cancer Father   . Diabetes Father     Filed   Vitals:   05/29/15 1133  BP: 124/72  Pulse: 96  Weight: 282 lb 12 oz (128.255 kg)  SpO2: 96%    PHYSICAL EXAM: General: Appears older than stated age. Obese HEENT: normal Neck: supple. Thick, JVD difficult to appreciate. Carotids 2+ bilat; no bruits. No lymphadenopathy or thryomegaly appreciated. Cor: PMI nondisplaced. Regular rate & rhythm. No rubs, gallops or murmurs. Lungs: CTA Abdomen: Obese, nontender, distended. No hepatosplenomegaly. No bruits or masses. Good bowel sounds. Extremities: no cyanosis, clubbing, rash, edema Neuro: alert & oriented x 3, cranial nerves grossly intact. moves all 4 extremities w/o difficulty. Affect pleasant.   ASSESSMENT & PLAN:  1. Chronic combined HF - EF 30-35%, Grade 2 DD, inferolateral akinesis 2. Cardiomyopathy, ischemic 3. CAD without angina s/p ICD - Sees Dr. Taylor 4. CKD - Recent diagnosis. Up into 4s.. Had been 0.8-1.0 previously. With admission for AKI 05/12/15 5. HTN 6. HLD - statin 7. Orthostatic hypotension  Was on CHF regimen but recently started having trouble with hypotension.Taken off losartan, spironolactone, and coreg. Now only taking Lasix at decreased dose.  Does not appear volume overloaded on exam. Sitting BP 110/68 Standing BP 92/58  Todd "Andy" Tillery, PA-C 05/29/2015 12:07 PM   Patient seen and examined with Andy Tillery, PA-C. We discussed all aspects of the encounter. I agree with the assessment and plan as stated above.   Weight is up and Optivol high but does not appear markedly volume overloaded on exam. His situation has been complicated by  severe orthostatic hypotension necessitating stopping almost all of his HF meds. 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 Will start midodrine 2.5 tid. Can titrate as needed and hopefully this will allows us to re-initiate some neurohormonal modulation for his HF. Will plan RHC as well to further evaluate filling pressures and outputs.   Total time spent 45 minutes. Over half that time spent discussing above.    Zidan Helget,MD 10:20 AM    

## 2015-05-29 NOTE — Patient Instructions (Signed)
START Midodrine 2.5mg  (1 tablet) 3 times a day with meals.  YOU ARE SCHEDULED FOR A RIGHT HEART CATH Thursday  06/01/15 at 9am (please see instructions)  LABS TODAY (bmet bnp)  FOLLOW UP in 4 weeks.

## 2015-05-30 NOTE — Progress Notes (Signed)
HPI: FU coronary artery disease and CHF. Patient previously cared for in [facility]. Patient has had a prior inferior posterior myocardial infarction. Cardiac catheterization in 2000 showed an occluded right coronary artery and patient had PCI at that time. Ejection fraction was 50%. Previous monitor showed frequent PVCs. Carotid Dopplers in January 2010 showed less than 50% bilateral stenosis. Cardiac catheterization December 2015 showed no significant coronary disease and ejection fraction 25%. Had ICD placed February 2016. Echocardiogram June 2016 showed ejection fraction 30-35%, grade 2 diastolic dysfunction. Patient has had recent problems with orthostasis and has had multiple medication adjustments. He developed acute renal insufficiency and was admitted. Improved with discontinuing heart failure medications. Patient scheduled for right heart catheterization on July 28 with Dr Gala Romney. Since he was last seen,   Current Outpatient Prescriptions  Medication Sig Dispense Refill  . aspirin 81 MG tablet Take 1 tablet (81 mg total) by mouth daily.    . clonazePAM (KLONOPIN) 1 MG tablet Take 1 mg by mouth 3 (three) times daily.     . furosemide (LASIX) 20 MG tablet Take 1 tablet (20 mg total) by mouth daily. 30 tablet 5  . gabapentin (NEURONTIN) 300 MG capsule Take 300 mg by mouth 2 (two) times daily.    Marland Kitchen lamoTRIgine (LAMICTAL) 200 MG tablet Take 200 mg by mouth at bedtime.     Marland Kitchen levocetirizine (XYZAL) 5 MG tablet Take 10 mg by mouth every evening.    . midodrine (PROAMATINE) 2.5 MG tablet Take 1 tablet (2.5 mg total) by mouth 3 (three) times daily with meals. 90 tablet 3  . modafinil (PROVIGIL) 200 MG tablet Take 400 mg by mouth daily as needed. To keep me going per patient    . montelukast (SINGULAIR) 10 MG tablet Take 10 mg by mouth daily.     . nitroGLYCERIN (NITROSTAT) 0.4 MG SL tablet Place 1 tablet (0.4 mg total) under the tongue every 5 (five) minutes as needed for chest pain.  (Patient not taking: Reported on 05/29/2015) 25 tablet 11  . Oxcarbazepine (TRILEPTAL) 300 MG tablet Take 300-900 mg by mouth See admin instructions. Take 2 tablet in the AM and 3 at night  0  . PROAIR HFA 108 (90 BASE) MCG/ACT inhaler Inhale 1-2 puffs into the lungs every 4 (four) hours as needed for wheezing or shortness of breath. Asthma  1  . simvastatin (ZOCOR) 80 MG tablet Take 1 tablet (80 mg total) by mouth daily. 30 tablet 11   No current facility-administered medications for this visit.     Past Medical History  Diagnosis Date  . CAD (coronary artery disease) 2000    PCI to RCA  . OSA (obstructive sleep apnea)   . Obesity   . Bipolar disorder   . Diverticulitis   . Ischemic cardiomyopathy 10/2014     EF 25% now with ICD- Medtronic  . Acute renal failure 05/12/2015  . Inferior myocardial infarction 2000  . Carotid arterial disease   . ICD (implantable cardioverter-defibrillator) in place 2016    Medtronic    Past Surgical History  Procedure Laterality Date  . Right elbow surgery    . Left heart catheterization with coronary angiogram N/A 10/13/2014    Procedure: LEFT HEART CATHETERIZATION WITH CORONARY ANGIOGRAM;  Surgeon: Corky Crafts, MD;  Location: [facility] CATH LAB;  Service: Cardiovascular;  Laterality: N/A;  . Implantable cardioverter defibrillator implant N/A 12/09/2014    Procedure: IMPLANTABLE CARDIOVERTER DEFIBRILLATOR IMPLANT;  Surgeon: Marinus Maw,  MD;  Location: [facility] CATH LAB;  Service: Cardiovascular;  Laterality: N/A;  . Coronary angioplasty with stent placement  2000    RCA stent    History   Social History  . Marital Status: Married    Spouse Name: N/A  . Number of Children: 2  . Years of Education: N/A   Occupational History  .     Social History Main Topics  . Smoking status: Former Smoker -- 1.00 packs/day for 18 years    Types: Cigarettes    Start date: 02/28/1981    Quit date: 11/04/1998  . Smokeless tobacco: Current User    Types:  Snuff  . Alcohol Use: 0.0 oz/week    0 Standard drinks or equivalent per week     Comment: Rarely  . Drug Use: Not on file  . Sexual Activity: Not on file   Other Topics Concern  . Not on file   Social History Narrative    ROS: no fevers or chills, productive cough, hemoptysis, dysphasia, odynophagia, melena, hematochezia, dysuria, hematuria, rash, seizure activity, orthopnea, PND, pedal edema, claudication. Remaining systems are negative.  Physical Exam: Well-developed well-nourished in no acute distress.  Skin is warm and dry.  HEENT is normal.  Neck is supple.  Chest is clear to auscultation with normal expansion.  Cardiovascular exam is regular rate and rhythm.  Abdominal exam nontender or distended. No masses palpated. Extremities show no edema. neuro grossly intact  ECG     This encounter was created in error - please disregard.

## 2015-05-31 ENCOUNTER — Encounter: Payer: BLUE CROSS/BLUE SHIELD | Admitting: Cardiology

## 2015-06-01 ENCOUNTER — Encounter (HOSPITAL_COMMUNITY)
Admission: RE | Disposition: A | Discharge status: Home / Self Care | Payer: BLUE CROSS/BLUE SHIELD | Source: Ambulatory Visit | Attending: Internal Medicine

## 2015-06-01 ENCOUNTER — Ambulatory Visit (HOSPITAL_COMMUNITY)
Admission: RE | Admit: 2015-06-01 | Discharge: 2015-06-01 | Disposition: A | Discharge status: Home / Self Care | Payer: BLUE CROSS/BLUE SHIELD | Source: Ambulatory Visit | Attending: Internal Medicine | Admitting: Internal Medicine

## 2015-06-01 DIAGNOSIS — F319 Bipolar disorder, unspecified: Secondary | ICD-10-CM | POA: Insufficient documentation

## 2015-06-01 DIAGNOSIS — I5022 Chronic systolic (congestive) heart failure: Secondary | ICD-10-CM | POA: Diagnosis not present

## 2015-06-01 DIAGNOSIS — I2510 Atherosclerotic heart disease of native coronary artery without angina pectoris: Secondary | ICD-10-CM | POA: Diagnosis not present

## 2015-06-01 DIAGNOSIS — J45909 Unspecified asthma, uncomplicated: Secondary | ICD-10-CM | POA: Insufficient documentation

## 2015-06-01 DIAGNOSIS — N189 Chronic kidney disease, unspecified: Secondary | ICD-10-CM | POA: Insufficient documentation

## 2015-06-01 DIAGNOSIS — Z955 Presence of coronary angioplasty implant and graft: Secondary | ICD-10-CM | POA: Diagnosis not present

## 2015-06-01 DIAGNOSIS — I951 Orthostatic hypotension: Secondary | ICD-10-CM | POA: Insufficient documentation

## 2015-06-01 DIAGNOSIS — I129 Hypertensive chronic kidney disease with stage 1 through stage 4 chronic kidney disease, or unspecified chronic kidney disease: Secondary | ICD-10-CM | POA: Diagnosis not present

## 2015-06-01 DIAGNOSIS — Z87891 Personal history of nicotine dependence: Secondary | ICD-10-CM | POA: Diagnosis not present

## 2015-06-01 DIAGNOSIS — Z95810 Presence of automatic (implantable) cardiac defibrillator: Secondary | ICD-10-CM | POA: Diagnosis not present

## 2015-06-01 DIAGNOSIS — I255 Ischemic cardiomyopathy: Secondary | ICD-10-CM | POA: Diagnosis not present

## 2015-06-01 DIAGNOSIS — G4733 Obstructive sleep apnea (adult) (pediatric): Secondary | ICD-10-CM | POA: Diagnosis not present

## 2015-06-01 DIAGNOSIS — E785 Hyperlipidemia, unspecified: Secondary | ICD-10-CM | POA: Diagnosis not present

## 2015-06-01 DIAGNOSIS — Z8249 Family history of ischemic heart disease and other diseases of the circulatory system: Secondary | ICD-10-CM | POA: Diagnosis not present

## 2015-06-01 DIAGNOSIS — I5042 Chronic combined systolic (congestive) and diastolic (congestive) heart failure: Secondary | ICD-10-CM | POA: Diagnosis not present

## 2015-06-01 DIAGNOSIS — Z7982 Long term (current) use of aspirin: Secondary | ICD-10-CM | POA: Diagnosis not present

## 2015-06-01 DIAGNOSIS — E669 Obesity, unspecified: Secondary | ICD-10-CM | POA: Insufficient documentation

## 2015-06-01 DIAGNOSIS — E1122 Type 2 diabetes mellitus with diabetic chronic kidney disease: Secondary | ICD-10-CM | POA: Diagnosis not present

## 2015-06-01 DIAGNOSIS — I454 Nonspecific intraventricular block: Secondary | ICD-10-CM | POA: Insufficient documentation

## 2015-06-01 DIAGNOSIS — I252 Old myocardial infarction: Secondary | ICD-10-CM | POA: Insufficient documentation

## 2015-06-01 HISTORY — PX: CARDIAC CATHETERIZATION: SHX172

## 2015-06-01 LAB — BASIC METABOLIC PANEL
ANION GAP: 11 (ref 5–15)
BUN: 9 mg/dL (ref 6–20)
CALCIUM: 8.8 mg/dL — AB (ref 8.9–10.3)
CO2: 26 mmol/L (ref 22–32)
CREATININE: 1.46 mg/dL — AB (ref 0.61–1.24)
Chloride: 104 mmol/L (ref 101–111)
GFR calc Af Amer: >60 mL/min (ref >60)
GFR calc non Af Amer: 56 mL/min — ABNORMAL LOW (ref >60)
Glucose, Bld: 119 mg/dL — ABNORMAL HIGH (ref 65–99)
Potassium: 3.8 mmol/L (ref 3.5–5.1)
SODIUM: 141 mmol/L (ref 135–145)

## 2015-06-01 LAB — GLUCOSE, CAPILLARY
Glucose-Capillary: 114 mg/dL — ABNORMAL HIGH (ref 65–99)
Glucose-Capillary: 96 mg/dL (ref 65–99)

## 2015-06-01 LAB — PROTIME-INR
INR: 1.06 (ref 0.00–1.49)
Prothrombin Time: 14 seconds (ref 11.6–15.2)

## 2015-06-01 LAB — POCT I-STAT 3, VENOUS BLOOD GAS (G3P V)
Acid-Base Excess: 1 mmol/L (ref 0.0–2.0)
Acid-Base Excess: 1 mmol/L (ref 0.0–2.0)
Bicarbonate: 27.3 mEq/L — ABNORMAL HIGH (ref 20.0–24.0)
Bicarbonate: 27.3 mEq/L — ABNORMAL HIGH (ref 20.0–24.0)
O2 SAT: 68 %
O2 SAT: 71 %
PCO2 VEN: 50.4 mmHg — AB (ref 45.0–50.0)
PH VEN: 7.341 — AB (ref 7.250–7.300)
TCO2: 29 mmol/L (ref 0–100)
TCO2: 29 mmol/L (ref 0–100)
pCO2, Ven: 50.5 mmHg — ABNORMAL HIGH (ref 45.0–50.0)
pH, Ven: 7.342 — ABNORMAL HIGH (ref 7.250–7.300)
pO2, Ven: 38 mmHg (ref 30.0–45.0)
pO2, Ven: 40 mmHg (ref 30.0–45.0)

## 2015-06-01 LAB — CBC
HEMATOCRIT: 36.7 % — AB (ref 39.0–52.0)
Hemoglobin: 12.5 g/dL — ABNORMAL LOW (ref 13.0–17.0)
MCH: 31.2 pg (ref 26.0–34.0)
MCHC: 34.1 g/dL (ref 30.0–36.0)
MCV: 91.5 fL (ref 78.0–100.0)
PLATELETS: 237 10*3/uL (ref 150–400)
RBC: 4.01 MIL/uL — AB (ref 4.22–5.81)
RDW: 14.5 % (ref 11.5–15.5)
WBC: 8.5 10*3/uL (ref 4.0–10.5)

## 2015-06-01 SURGERY — RIGHT HEART CATH

## 2015-06-01 MED ORDER — ACETAMINOPHEN 325 MG PO TABS: 650.0000 mg | ORAL_TABLET | ORAL | Status: DC | PRN

## 2015-06-01 MED ORDER — ONDANSETRON HCL 4 MG/2ML IJ SOLN: 4.0000 mg | Freq: Four times a day (QID) | INTRAMUSCULAR | Status: DC | PRN

## 2015-06-01 MED ORDER — SODIUM CHLORIDE 0.9 % IV SOLN: 250.0000 mL | INTRAVENOUS | Status: DC | PRN

## 2015-06-01 MED ORDER — FENTANYL CITRATE (PF) 100 MCG/2ML IJ SOLN
INTRAMUSCULAR | Status: AC
  Filled 2015-06-01: qty 2

## 2015-06-01 MED ORDER — MIDAZOLAM HCL 2 MG/2ML IJ SOLN
INTRAMUSCULAR | Status: AC
  Filled 2015-06-01: qty 2

## 2015-06-01 MED ORDER — SODIUM CHLORIDE 0.9 % IJ SOLN: 3.0000 mL | INTRAMUSCULAR | Status: DC | PRN

## 2015-06-01 MED ORDER — SODIUM CHLORIDE 0.9 % IJ SOLN: 3.0000 mL | Freq: Two times a day (BID) | INTRAMUSCULAR | Status: DC

## 2015-06-01 MED ORDER — FENTANYL CITRATE (PF) 100 MCG/2ML IJ SOLN
INTRAMUSCULAR | Status: DC | PRN
  Administered 2015-06-01 (×2): 25 ug via INTRAVENOUS

## 2015-06-01 MED ORDER — LIDOCAINE HCL (PF) 1 % IJ SOLN
INTRAMUSCULAR | Status: AC
  Filled 2015-06-01: qty 30

## 2015-06-01 MED ORDER — MIDAZOLAM HCL 2 MG/2ML IJ SOLN
INTRAMUSCULAR | Status: DC | PRN
  Administered 2015-06-01: 2 mg via INTRAVENOUS

## 2015-06-01 MED ORDER — HEPARIN (PORCINE) IN NACL 2-0.9 UNIT/ML-% IJ SOLN
INTRAMUSCULAR | Status: AC
  Filled 2015-06-01: qty 500

## 2015-06-01 MED ORDER — SODIUM CHLORIDE 0.9 % IV SOLN: INTRAVENOUS | Status: DC

## 2015-06-01 SURGICAL SUPPLY — 6 items
CATH SWAN GANZ 7F STRAIGHT (CATHETERS) IMPLANT
KIT HEART RIGHT NAMIC (KITS) ×3 IMPLANT
PACK CARDIAC CATHETERIZATION (CUSTOM PROCEDURE TRAY) ×3 IMPLANT
SHEATH PINNACLE 7F 10CM (SHEATH) IMPLANT
TRANSDUCER W/STOPCOCK (MISCELLANEOUS) ×3 IMPLANT
WIRE EMERALD 3MM-J .035X150CM (WIRE) IMPLANT

## 2015-06-01 NOTE — Interval H&P Note (Signed)
History and Physical Interval Note:  06/01/2015 10:44 AM  Todd Jackson  has presented today for surgery, with the diagnosis of hf  The various methods of treatment have been discussed with the patient and family. After consideration of risks, benefits and other options for treatment, the patient has consented to  Procedure(s): Right Heart Cath (N/A) as a surgical intervention .  The patient's history has been reviewed, patient examined, no change in status, stable for surgery.  I have reviewed the patient's chart and labs.  Questions were answered to the patient's satisfaction.     Bensimhon, Reuel Boom

## 2015-06-01 NOTE — H&P (View-Only) (Signed)
Patient ID: Todd Jackson, male   DOB: September 20, 1971, 44 y.o.   MRN: [id number]  Referring Physician: Norma Fredrickson PCP: Talitha Givens Primary Cardiologist: Crenshaw/ Taylor/  Primary HF: Kersti Scavone  HPI:  Todd Jackson is a 44 y.o. male with Chronic combined CHF (Echo 05/04/15 30-35% Gr 2 DD), Ischemic cardiomyopathy, CAD s/p PCI with stenting of RCA in 2000, CKD, OSA, Obesity, Bipolar disorder, DM2 (diagnosed 1 year ago). EF was 50% via cath in 2000.  Echo 10/2014 showed EF 25%, moderate LAE and mild MR.  Cath 12/15 showed no significant CAD and EF 25%.   Had Medtronic ICV with Optivol implanted 2/16.  Here with his wife. Says he started feeling poorly in January. Falling and feeling weak. Found to be orthostatic. Has had multiple med adjustments recently. Spironolactone and losartan stopped completely at end of June.  Was admitted on 7/8 with AKI. Creatinine ~1.0 -> 4.5. Carvedilol restarted at low dose but stopped over the weekend for dizziness. He reports today to establish in HF clinic referred by Norma Fredrickson. Starting in the middle of June he has had trouble with low blood pressure and lack of energy. Has complaints of dizziness with walking and lightheadedness that has led to 10-12 falls since January. Has had near syncopal events. Denies room spinning, but says in midst of episode it feels like the floor is coming up at him. Was taking off all of his cardiac meds after a hospitalization. Has days he can walk through [facility] without SOB, and other days he can't even walk 20 ft without SOB. Does occasionally have DOE at rest. Denies edema, says his belly does get big. He has OSA with CPAP but complains of significant daytime fatigue and feels constantly tired and weak.  Takes provigil to help him stay awake.  Denies any illness or changes in diet.  Has been eating less.  Eating a low salt diet. Says he drinks 4 16 oz bottles + 2-3 sodas per day. Takes BP at home frequently SBP 80-120. Diagnosed with  DM2 about 1 year ago but has recently been diagnosed with peripheral neuropathy.   ECHO 05/04/15 EF 30-55%, Grade 2 DD, Diffuse hypokinesis with akinesis of entire inferolateral/inferior myocardium. Trivial MR  LHC 10/13/14 LM: Widely patent. LAD: widely patent. LCx: There is only mild disease in the circumflex system. RCA: The stent in the mid vessel is widely patent. There is mild disease in the distal RCA.  EF: 25%, inferior akinesis. LVEDP was 32 mmHg.  Carotid US 11/29/08 Bilateral ICA < 50%   Review of Systems: [y] = yes, [ ]  = no   General: Weight gain [ ] ; Weight loss [y]; Anorexia [ ] ; Fatigue [y]; Fever [ ] ; Chills [ ] ; Weakness [ ]   Cardiac: Chest pain/pressure [y]; Resting SOB [y]; Exertional SOB [y]; Orthopnea [y]; Pedal Edema [ ] ; Palpitations [y]; Syncope Todd Jackson ]; Presyncope [y]; Paroxysmal nocturnal dyspnea[ ]   Pulmonary: Cough [y]; Wheezing[ ] ; Hemoptysis[ ] ; Sputum [ ] ; Snoring [ ]   GI: Vomiting[ ] ; Dysphagia[ ] ; Melena[ ] ; Hematochezia [ ] ; Heartburn[ ] ; Abdominal pain [ ] ; Constipation [ ] ; Diarrhea [ ] ; BRBPR [ ]   GU: Hematuria[ ] ; Dysuria [ ] ; Nocturia[ ]   Vascular: Pain in legs with walking [ ] ; Pain in feet with lying flat [ ] ; Non-healing sores [ ] ; Stroke [ ] ; TIA [ ] ; Slurred speech [ ] ;  Neuro: Headaches[ ] ; Vertigo[ ] ; Seizures[ ] ; Paresthesias[ ] ;Blurred vision [ ] ; Diplopia [ ] ; Vision changes [ ]   Ortho/Skin:  Arthritis Todd Jackson ]; Joint pain Todd Jackson ]; Muscle pain [ ] ; Joint swelling [ ] ; Back Pain [y]; Rash [ ]   Psych: Depression[ ] ; Anxiety[ ]   Heme: Bleeding problems [ ] ; Clotting disorders [ ] ; Anemia [ ]   Endocrine: Diabetes Todd Jackson ]; Thyroid dysfunction[ ]    Past Medical History  Diagnosis Date  . CAD (coronary artery disease) 2000    PCI to RCA  . OSA (obstructive sleep apnea)   . Obesity   . Bipolar disorder   . Diverticulitis   . Ischemic cardiomyopathy 10/2014     EF 25% now with ICD- Medtronic  . Acute renal failure 05/12/2015  . Inferior myocardial  infarction 2000  . Carotid arterial disease   . ICD (implantable cardioverter-defibrillator) in place 2016    Medtronic    Current Outpatient Prescriptions  Medication Sig Dispense Refill  . aspirin 81 MG tablet Take 1 tablet (81 mg total) by mouth daily.    . clonazePAM (KLONOPIN) 1 MG tablet Take 1 mg by mouth 3 (three) times daily.     . furosemide (LASIX) 20 MG tablet Take 1 tablet (20 mg total) by mouth daily. 30 tablet 5  . gabapentin (NEURONTIN) 300 MG capsule Take 300 mg by mouth 2 (two) times daily.    Marland Kitchen lamoTRIgine (LAMICTAL) 200 MG tablet Take 200 mg by mouth at bedtime.     Marland Kitchen levocetirizine (XYZAL) 5 MG tablet Take 10 mg by mouth every evening.    . modafinil (PROVIGIL) 200 MG tablet Take 400 mg by mouth daily as needed. To keep me going per patient    . montelukast (SINGULAIR) 10 MG tablet Take 10 mg by mouth daily.     . Oxcarbazepine (TRILEPTAL) 300 MG tablet Take 300-900 mg by mouth See admin instructions. Take 2 tablet in the AM and 3 at night  0  . PROAIR HFA 108 (90 BASE) MCG/ACT inhaler Inhale 1-2 puffs into the lungs every 4 (four) hours as needed for wheezing or shortness of breath. Asthma  1  . simvastatin (ZOCOR) 80 MG tablet Take 1 tablet (80 mg total) by mouth daily. 30 tablet 11  . nitroGLYCERIN (NITROSTAT) 0.4 MG SL tablet Place 1 tablet (0.4 mg total) under the tongue every 5 (five) minutes as needed for chest pain. (Patient not taking: Reported on 05/29/2015) 25 tablet 11   No current facility-administered medications for this encounter.    No Known Allergies    History   Social History  . Marital Status: Married    Spouse Name: N/A  . Number of Children: 2  . Years of Education: N/A   Occupational History  .     Social History Main Topics  . Smoking status: Former Smoker -- 1.00 packs/day for 18 years    Types: Cigarettes    Start date: 02/28/1981    Quit date: 11/04/1998  . Smokeless tobacco: Current User    Types: Snuff  . Alcohol Use: 0.0  oz/week    0 Standard drinks or equivalent per week     Comment: Rarely  . Drug Use: Not on file  . Sexual Activity: Not on file   Other Topics Concern  . Not on file   Social History Narrative      Family History  Problem Relation Age of Onset  . CAD Mother     MI at age 44  . Alcohol abuse Mother   . Colon cancer Father   . Diabetes Father     Ceasar Mons  Vitals:   05/29/15 1133  BP: 124/72  Pulse: 96  Weight: 282 lb 12 oz (128.255 kg)  SpO2: 96%    PHYSICAL EXAM: General: Appears older than stated age. Obese HEENT: normal Neck: supple. Thick, JVD difficult to appreciate. Carotids 2+ bilat; no bruits. No lymphadenopathy or thryomegaly appreciated. Cor: PMI nondisplaced. Regular rate & rhythm. No rubs, gallops or murmurs. Lungs: CTA Abdomen: Obese, nontender, distended. No hepatosplenomegaly. No bruits or masses. Good bowel sounds. Extremities: no cyanosis, clubbing, rash, edema Neuro: alert & oriented x 3, cranial nerves grossly intact. moves all 4 extremities w/o difficulty. Affect pleasant.   ASSESSMENT & PLAN:  1. Chronic combined HF - EF 30-35%, Grade 2 DD, inferolateral akinesis 2. Cardiomyopathy, ischemic 3. CAD without angina s/p ICD - Sees Dr. Ladona Ridgel 4. CKD - Recent diagnosis. Up into 4s.. Had been 0.8-1.0 previously. With admission for AKI 05/12/15 5. HTN 6. HLD - statin 7. Orthostatic hypotension  Was on CHF regimen but recently started having trouble with hypotension.Taken off losartan, spironolactone, and coreg. Now only taking Lasix at decreased dose.  Does not appear volume overloaded on exam. Sitting BP 110/68 Standing BP 92/58  Baldwin Crown" [place], [place] 05/29/2015 12:07 PM   Patient seen and examined with Otilio Saber, PA-C. We discussed all aspects of the encounter. I agree with the assessment and plan as stated above.   Weight is up and Optivol high but does not appear markedly volume overloaded on exam. His situation has been complicated by  severe orthostatic hypotension necessitating stopping almost all of his HF meds. 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 Will start midodrine 2.5 tid. Can titrate as needed and hopefully this will allows [place] to re-initiate some neurohormonal modulation for his HF. Will plan RHC as well to further evaluate filling pressures and outputs.   Total time spent 45 minutes. Over half that time spent discussing above.    Namir Neto,MD 10:20 AM

## 2015-06-01 NOTE — Discharge Instructions (Signed)
Angiogram, Care After Refer to this sheet in the next few weeks. These instructions provide you with information on caring for yourself after your procedure. Your health care provider may also give you more specific instructions. Your treatment has been planned according to current medical practices, but problems sometimes occur. Call your health care provider if you have any problems or questions after your procedure.  WHAT TO EXPECT AFTER THE PROCEDURE After your procedure, it is typical to have the following sensations:  Minor discomfort or tenderness and a small bump at the catheter insertion site. The bump should usually decrease in size and tenderness within 1 to 2 weeks.  Any bruising will usually fade within 2 to 4 weeks. HOME CARE INSTRUCTIONS   You may need to keep taking blood thinners if they were prescribed for you. Take medicines only as directed by your health care provider.  Do not apply powder or lotion to the site.  Do not take baths, swim, or use a hot tub until 3 to 5 days   You may shower 24 hours after the procedure. Remove the bandage (dressing) and gently wash the site with plain soap and water. Gently pat the site dry.  Inspect the site at least twice daily.  Limit your activity for the first 48 hours. Do not bend, squat, or lift anything over 20 lb (9 kg) or as directed by your health care provider.  Plan to have someone take you home after the procedure. Follow instructions about when you can drive or return to work. SEEK MEDICAL CARE IF:  You get light-headed when standing up.  You have drainage (other than a small amount of blood on the dressing).  You have chills.  You have a fever.  You have redness, warmth, swelling, or pain at the insertion site. SEEK IMMEDIATE MEDICAL CARE IF:   You develop chest pain or shortness of breath, feel faint, or pass out.  You have bleeding, swelling larger than a walnut, or drainage from the catheter insertion  site.  You develop pain, discoloration, coldness, or severe bruising in the leg or arm that held the catheter.  You develop bleeding from any other place, such as the bowels. You may see bright red blood in your urine or stools, or your stools may appear black and tarry.  You have heavy bleeding from the site. If this happens, hold pressure on the site. MAKE SURE YOU:  Understand these instructions.  Will watch your condition.  Will get help right away if you are not doing well or get worse. Document Released: 05/09/2005 Document Revised: 03/07/2014 Document Reviewed: 03/15/2013 [facility] Patient Information 2015 [place], [place]. This information is not intended to replace advice given to you by your health care provider. Make sure you discuss any questions you have with your health care provider.

## 2015-06-02 ENCOUNTER — Telehealth (HOSPITAL_COMMUNITY): Payer: Self-pay | Admitting: Cardiology

## 2015-06-02 ENCOUNTER — Encounter (HOSPITAL_COMMUNITY): Payer: Self-pay | Admitting: Internal Medicine

## 2015-06-02 MED FILL — Heparin Sodium (Porcine) 2 Unit/ML in Sodium Chloride 0.9%: INTRAMUSCULAR | Qty: 500 | Status: AC

## 2015-06-02 MED FILL — Lidocaine HCl Local Preservative Free (PF) Inj 1%: INTRAMUSCULAR | Qty: 30 | Status: AC

## 2015-06-02 NOTE — Telephone Encounter (Signed)
Pt scheduled for RHC with Dr.Bensinmhon Cpt code 40981 With pts current insurance- [employer]

## 2015-06-07 NOTE — Telephone Encounter (Signed)
Completed - Returned call to patient's wife Darl Pikes, explained STDisability paperwork was sent to [facility] for completion. Provided phone for Darl Pikes to contact [employer] for update. Jim Like MHA RN CCM   By Patricia Pesa, RN

## 2015-06-22 ENCOUNTER — Telehealth: Payer: Self-pay | Admitting: Cardiology

## 2015-06-22 NOTE — Telephone Encounter (Signed)
Received records from [facility] for appointment on 07/26/15 with Dr  Jens Som.  Records given to [facility] (medical records) for Dr Ludwig Clarks schedule on 07/26/15. lp

## 2015-06-26 ENCOUNTER — Ambulatory Visit (HOSPITAL_COMMUNITY)
Admission: RE | Admit: 2015-06-26 | Discharge: 2015-06-26 | Disposition: A | Discharge status: Home / Self Care | Payer: BLUE CROSS/BLUE SHIELD | Source: Ambulatory Visit | Attending: Cardiology | Admitting: Cardiology

## 2015-06-26 ENCOUNTER — Encounter (HOSPITAL_COMMUNITY): Payer: Self-pay

## 2015-06-26 ENCOUNTER — Encounter (HOSPITAL_COMMUNITY): Payer: Self-pay | Admitting: *Deleted

## 2015-06-26 VITALS — BP 130/81 | HR 112 | Resp 20 | Wt 281.5 lb

## 2015-06-26 DIAGNOSIS — N189 Chronic kidney disease, unspecified: Secondary | ICD-10-CM | POA: Insufficient documentation

## 2015-06-26 DIAGNOSIS — G4733 Obstructive sleep apnea (adult) (pediatric): Secondary | ICD-10-CM | POA: Diagnosis not present

## 2015-06-26 DIAGNOSIS — I2510 Atherosclerotic heart disease of native coronary artery without angina pectoris: Secondary | ICD-10-CM | POA: Insufficient documentation

## 2015-06-26 DIAGNOSIS — Z79899 Other long term (current) drug therapy: Secondary | ICD-10-CM | POA: Diagnosis not present

## 2015-06-26 DIAGNOSIS — I951 Orthostatic hypotension: Secondary | ICD-10-CM | POA: Diagnosis not present

## 2015-06-26 DIAGNOSIS — I5022 Chronic systolic (congestive) heart failure: Secondary | ICD-10-CM

## 2015-06-26 DIAGNOSIS — Z95810 Presence of automatic (implantable) cardiac defibrillator: Secondary | ICD-10-CM | POA: Diagnosis not present

## 2015-06-26 DIAGNOSIS — I255 Ischemic cardiomyopathy: Secondary | ICD-10-CM | POA: Insufficient documentation

## 2015-06-26 DIAGNOSIS — E669 Obesity, unspecified: Secondary | ICD-10-CM | POA: Diagnosis not present

## 2015-06-26 DIAGNOSIS — Z9861 Coronary angioplasty status: Secondary | ICD-10-CM | POA: Diagnosis not present

## 2015-06-26 DIAGNOSIS — E1122 Type 2 diabetes mellitus with diabetic chronic kidney disease: Secondary | ICD-10-CM | POA: Insufficient documentation

## 2015-06-26 DIAGNOSIS — I5042 Chronic combined systolic (congestive) and diastolic (congestive) heart failure: Secondary | ICD-10-CM | POA: Insufficient documentation

## 2015-06-26 DIAGNOSIS — F319 Bipolar disorder, unspecified: Secondary | ICD-10-CM | POA: Insufficient documentation

## 2015-06-26 DIAGNOSIS — E785 Hyperlipidemia, unspecified: Secondary | ICD-10-CM | POA: Insufficient documentation

## 2015-06-26 DIAGNOSIS — Z7982 Long term (current) use of aspirin: Secondary | ICD-10-CM | POA: Insufficient documentation

## 2015-06-26 DIAGNOSIS — Z955 Presence of coronary angioplasty implant and graft: Secondary | ICD-10-CM | POA: Diagnosis not present

## 2015-06-26 MED ORDER — SPIRONOLACTONE 25 MG PO TABS: 25.0000 mg | ORAL_TABLET | Freq: Every day | ORAL | Status: DC

## 2015-06-26 MED ORDER — METOPROLOL SUCCINATE ER 25 MG PO TB24
25.0000 mg | ORAL_TABLET | Freq: Every day | ORAL | Status: DC
Start: 2015-06-26 — End: 2015-08-28

## 2015-06-26 NOTE — Progress Notes (Signed)
Patient ID: Todd Jackson, male   DOB: [birth year], 44 y.o.   MRN: [id number]  Referring Physician: Norma Fredrickson PCP: Talitha Givens Primary Cardiologist: Jerrell Mylar Primary HF: Bensimhon  HPI:  Todd Jackson is a 44 y.o. male with Chronic combined CHF (Echo 05/04/15 30-35% Gr 2 DD), Ischemic cardiomyopathy, CAD s/p PCI with stenting of RCA in 2000, CKD, OSA, Obesity, Bipolar disorder, DM2 (diagnosed 1 year ago). EF was 50% via cath in 2000.  Echo 10/2014 showed EF 25%, moderate LAE and mild MR.  Cath 12/15 showed no significant CAD and EF 25%.   Had Medtronic ICD with Optivol implanted 2/16.  Started feeling poorly in January. Falling and feeling weak. Found to be orthostatic. Has had multiple med adjustments recently. Spironolactone and losartan stopped completely at end of June.  Was admitted on 7/8 with AKI. Creatinine ~1.0 -> 4.5.  I recently started him on midodrine for autonomic neuropathy. BP improved but has been off all HF meds except lasix. Underwent RHC 06/01/15 with normal filling pressures and output. Returns for f/u. Was getting hypertensive and headaches with tid midodrine so decreased to just qam. Now taking 2.5mg  qam.  Now SBP ranges 110-150 but BP can drop to 100 when standing. Gets a little woozy-headed but also has trouble with balance but not sure if is related to BP or proprioception problem. Going to Neurology tomorrow for NCS. Taking lasix once daily with good result. Denies edema, orthopnea or PND. + DOE with step or longer distances.   ICD interrogation: Optivol high but coming down. No VT/AF. Activity level now increasing slightly > 1 houre   ECHO 05/04/15 EF 30-35%, Grade 2 DD, Diffuse hypokinesis with akinesis of entire inferolateral/inferior myocardium. Trivial MR  RHC 06/01/15 RA = 5 RV = 37/2/8 PA = 32/9 (21) PCW = 12 Fick cardiac output/index = 6.5/2.6 Thermo cardiac output/index = 6.1/2.5 PVR = 1.5 WU SVR = 1,180  NIBP = 135/89 (101) FA sat = 68%,  71% PA sat = 99%  LHC 10/13/14 LM: Widely patent. LAD: widely patent. LCx: There is only mild disease in the circumflex system. RCA: The stent in the mid vessel is widely patent. There is mild disease in the distal RCA.  EF: 25%, inferior akinesis. LVEDP was 32 mmHg.  Carotid US 11/29/08 Bilateral ICA < 50%   Current Outpatient Prescriptions  Medication Sig Dispense Refill  . aspirin 81 MG tablet Take 1 tablet (81 mg total) by mouth daily.    . clonazePAM (KLONOPIN) 1 MG tablet Take 1 mg by mouth 3 (three) times daily.     . furosemide (LASIX) 20 MG tablet Take 1 tablet (20 mg total) by mouth daily. 30 tablet 5  . gabapentin (NEURONTIN) 300 MG capsule Take 300 mg by mouth 2 (two) times daily.    Marland Kitchen lamoTRIgine (LAMICTAL) 200 MG tablet Take 200 mg by mouth at bedtime.     Marland Kitchen levocetirizine (XYZAL) 5 MG tablet Take 5 mg by mouth every evening.     . midodrine (PROAMATINE) 2.5 MG tablet Take 1 tablet (2.5 mg total) by mouth 3 (three) times daily with meals. 90 tablet 3  . modafinil (PROVIGIL) 200 MG tablet Take 400 mg by mouth daily. To keep me going per patient    . montelukast (SINGULAIR) 10 MG tablet Take 10 mg by mouth daily.     . nitroGLYCERIN (NITROSTAT) 0.4 MG SL tablet Place 1 tablet (0.4 mg total) under the tongue every 5 (five) minutes as needed for chest  pain. 25 tablet 11  . Oxcarbazepine (TRILEPTAL) 300 MG tablet Take 300-900 mg by mouth See admin instructions. Take 2 tablet in the AM and 3 at night  0  . PROAIR HFA 108 (90 BASE) MCG/ACT inhaler Inhale 1-2 puffs into the lungs every 4 (four) hours as needed for wheezing or shortness of breath. Asthma  1  . simvastatin (ZOCOR) 80 MG tablet Take 1 tablet (80 mg total) by mouth daily. 30 tablet 11   No current facility-administered medications for this encounter.   Filed Vitals:   06/26/15 1114  BP: 130/81  Pulse: 112  Resp: 20  Weight: 281 lb 8 oz (127.688 kg)  SpO2: 96%    PHYSICAL EXAM: General: Appears older  than stated age. Obese HEENT: normal Neck: supple. Thick, JVP flat. Carotids 2+ bilat; no bruits. No lymphadenopathy or thryomegaly appreciated. Cor: PMI nondisplaced. Tachycardic Irregular rate & rhythm. No rubs, gallops or murmurs. Lungs: CTA Abdomen: Obese, nontender, distended. No hepatosplenomegaly. No bruits or masses. Good bowel sounds. Extremities: no cyanosis, clubbing, rash, edema Neuro: alert & oriented x 3, cranial nerves grossly intact. moves all 4 extremities w/o difficulty. Affect pleasant.  ECG: Sinus tach 112 + frequent PVCs.  ASSESSMENT & PLAN:  1. Chronic combined HF - EF 30-35%, Grade 2 DD, inferolateral akinesis   --s/p S[place] ICD - Sees Dr. Ladona Ridgel 2. Cardiomyopathy, ischemic 3. CAD without angina 4. Orthostatic hypotension 5. DM2 6. HLD 7. Bipolar d/o  RHC reviewed with him. ICD interrogated personally. He is mildly improved but still struggling with wide BP swings and orthostasis. Midodrine now down to 2.5 daily. Will add spiro 12.5 daily to reinitiate neurohormonal blockade and assist with gentle diuresis. Wil also start Toprol 25 mg daily. Can increase midodrine as needed. Has appt with Neuro for NCS. He is again asking about disability timeline and I have said we will assess month-to-month from a cardiac status. Given NYHA III symptoms will suggest he stay out of work for now.   Bensimhon, Daniel,MD 11:08 AM

## 2015-06-26 NOTE — Patient Instructions (Signed)
START Spironolactone  daily.  START Toprol  at bedtime.  LABS in 1 week (bmet)  Social Work Appt for disability.

## 2015-06-26 NOTE — Progress Notes (Signed)
[employer] disability insurance forms completed and signed by Dr. Gala Romney for encounter dates 05/29/15, 06/01/15, 06/26/15 and through to next appointment due 1-2 months to remain out of work.    Ave Filter

## 2015-07-05 ENCOUNTER — Ambulatory Visit (HOSPITAL_COMMUNITY)
Admission: RE | Admit: 2015-07-05 | Discharge: 2015-07-05 | Disposition: A | Discharge status: Home / Self Care | Payer: BLUE CROSS/BLUE SHIELD | Source: Ambulatory Visit | Attending: Internal Medicine | Admitting: Internal Medicine

## 2015-07-05 DIAGNOSIS — I5022 Chronic systolic (congestive) heart failure: Secondary | ICD-10-CM | POA: Diagnosis not present

## 2015-07-05 LAB — BASIC METABOLIC PANEL
ANION GAP: 11 (ref 5–15)
BUN: 7 mg/dL (ref 6–20)
CHLORIDE: 101 mmol/L (ref 101–111)
CO2: 24 mmol/L (ref 22–32)
Calcium: 9.4 mg/dL (ref 8.9–10.3)
Creatinine, Ser: 1.21 mg/dL (ref 0.61–1.24)
GFR calc Af Amer: >60 mL/min (ref >60)
Glucose, Bld: 87 mg/dL (ref 65–99)
POTASSIUM: 3.8 mmol/L (ref 3.5–5.1)
SODIUM: 136 mmol/L (ref 135–145)

## 2015-07-06 ENCOUNTER — Ambulatory Visit (INDEPENDENT_AMBULATORY_CARE_PROVIDER_SITE_OTHER): Payer: BLUE CROSS/BLUE SHIELD

## 2015-07-06 DIAGNOSIS — Z95810 Presence of automatic (implantable) cardiac defibrillator: Secondary | ICD-10-CM

## 2015-07-06 DIAGNOSIS — I5022 Chronic systolic (congestive) heart failure: Secondary | ICD-10-CM | POA: Diagnosis not present

## 2015-07-07 NOTE — Progress Notes (Signed)
EPIC Encounter for ICM Monitoring  Patient Name: Todd Jackson is a 44 y.o. male Date: 07/07/2015 Primary Care Physican: Manson Passey Primary Cardiologist: Crenshaw/Bensimhon Electrophysiologist: Ladona Ridgel Dry Weight: 281 lbs       In the past month, have you:  1. Gained more than 2 pounds in a day or more than 5 pounds in a week? no  2. Had changes in your medications (with verification of current medications)? Yes, he is taking Midodrine 2.5mg  every morning.  He stated Dr Gala Romney will be adding other meds back to his regimen but wants to do it slowly.  3. Had more shortness of breath than is usual for you? no  4. Limited your activity because of shortness of breath? no  5. Not been able to sleep because of shortness of breath? no  6. Had increased swelling in your feet or ankles? no  7. Had symptoms of dehydration (dizziness, dry mouth, increased thirst, decreased urine output) no  8. Had changes in sodium restriction? no  9. Been compliant with medication? Yes   ICM trend:   Follow-up plan: ICM clinic phone appointment not scheduled.  Patient reported he had ER visit at [facility] 07/06/2015, no admission.  He stated he fell upon rising from chair due to loss of muscle control.   911 was called due to wife unable to get him off the floor and he was mumbling.  Hospitalist unable to determine cause of episode but thought it could be vertigo.  He does not agree since he had no dizziness.  He is following up with PCP. He has been diagnosed with Neuropathy from knees down.  BP at time paramedics arrived at his house was 200/70 and 135/70.  He reported it was not a syncope episode.   He reported he had cardiac cath and Dr Gala Romney said that his fluid levels are perfect and unsure why the Optivol readings have been showing fluid retention.  He is being followed in HF clinic very closely and stated he would rather just have Dr Gala Romney to read his fluids for now and agrees to call back to  resume ICM monthly follow for the Optivol readings once he is no longer being seen in HF clinic so often.  Paramedics suggested patient have device transmission reviewed when he collapsed yesterday.  Advised no alerts were triggered on the transmission but would have device clinic review the transmission.    Copy of note sent to patient's primary care physician, primary cardiologist, and device following physician.  Karie Soda, RN, CCM 07/07/2015 10:07 AM

## 2015-07-13 ENCOUNTER — Encounter: Payer: Self-pay | Admitting: Licensed Clinical Social Worker

## 2015-07-13 NOTE — Progress Notes (Signed)
CSW met with patient and his wife in the clinic. Patient had multiple questions surrounding disability options. Patient shared that he has worked for 22 years and currently out of work on a long term disability through his employer benefits. Patient's wife also struggling with medical issues and has been a housewife with no income. Patient shared concerns about financial status if he is permanently disabled and how will he cover the costs of health insurance. Patient inquired about medicaid and other options. CSW discussed disability process, medicare eligibility and insurance through healthcare marketplace. CSW provided information/contact numbers on [employer], [place]. Patient and wife appear frustrated with lack of options and concerned for future financial stability. CSW provided supportive intervention and encouraged patient and wife to return with any questions. Raquel Sarna, [place]

## 2015-07-25 ENCOUNTER — Encounter (HOSPITAL_COMMUNITY): Payer: Self-pay | Admitting: *Deleted

## 2015-07-25 ENCOUNTER — Encounter (HOSPITAL_COMMUNITY): Payer: Self-pay

## 2015-07-25 ENCOUNTER — Ambulatory Visit (HOSPITAL_COMMUNITY)
Admission: RE | Admit: 2015-07-25 | Discharge: 2015-07-25 | Disposition: A | Discharge status: Home / Self Care | Payer: BLUE CROSS/BLUE SHIELD | Source: Ambulatory Visit | Attending: Cardiology | Admitting: Cardiology

## 2015-07-25 VITALS — BP 132/80 | HR 98 | Wt 281.8 lb

## 2015-07-25 DIAGNOSIS — E119 Type 2 diabetes mellitus without complications: Secondary | ICD-10-CM | POA: Insufficient documentation

## 2015-07-25 DIAGNOSIS — I5022 Chronic systolic (congestive) heart failure: Secondary | ICD-10-CM | POA: Diagnosis not present

## 2015-07-25 DIAGNOSIS — I5042 Chronic combined systolic (congestive) and diastolic (congestive) heart failure: Secondary | ICD-10-CM | POA: Diagnosis not present

## 2015-07-25 DIAGNOSIS — Z955 Presence of coronary angioplasty implant and graft: Secondary | ICD-10-CM | POA: Insufficient documentation

## 2015-07-25 DIAGNOSIS — G4733 Obstructive sleep apnea (adult) (pediatric): Secondary | ICD-10-CM | POA: Insufficient documentation

## 2015-07-25 DIAGNOSIS — I255 Ischemic cardiomyopathy: Secondary | ICD-10-CM | POA: Diagnosis not present

## 2015-07-25 DIAGNOSIS — E669 Obesity, unspecified: Secondary | ICD-10-CM | POA: Insufficient documentation

## 2015-07-25 DIAGNOSIS — I951 Orthostatic hypotension: Secondary | ICD-10-CM | POA: Diagnosis not present

## 2015-07-25 DIAGNOSIS — E785 Hyperlipidemia, unspecified: Secondary | ICD-10-CM | POA: Diagnosis not present

## 2015-07-25 DIAGNOSIS — Z79899 Other long term (current) drug therapy: Secondary | ICD-10-CM | POA: Insufficient documentation

## 2015-07-25 DIAGNOSIS — F319 Bipolar disorder, unspecified: Secondary | ICD-10-CM | POA: Insufficient documentation

## 2015-07-25 DIAGNOSIS — Z95810 Presence of automatic (implantable) cardiac defibrillator: Secondary | ICD-10-CM | POA: Insufficient documentation

## 2015-07-25 DIAGNOSIS — I2510 Atherosclerotic heart disease of native coronary artery without angina pectoris: Secondary | ICD-10-CM | POA: Diagnosis not present

## 2015-07-25 DIAGNOSIS — Z7982 Long term (current) use of aspirin: Secondary | ICD-10-CM | POA: Diagnosis not present

## 2015-07-25 MED ORDER — LOSARTAN POTASSIUM 25 MG PO TABS: 25.0000 mg | ORAL_TABLET | Freq: Every day | ORAL | Status: DC

## 2015-07-25 NOTE — Progress Notes (Signed)
HPI: FU chronic combined CHF, Ischemic cardiomyopathy, CAD s/p PCI with stenting of RCA in 2000, CKD, OSA, Obesity, Bipolar disorder, DM2. EF was 50% via cath in 2000. Echo 10/2014 showed EF 25%, moderate LAE and mild MR. Cath 12/15 showed no significant CAD and EF 25%. Had Medtronic ICD with Optivol implanted 2/16.  Has had problems with orthostasis felt due to autonomic neuropathy; midodrine initiated. BP improved. Underwent RHC 06/01/15 with normal filling pressures and output.   Since last seen, patient has seen neurology. It was felt that his orthostasis was related to gabapentin toxicity. This medication was discontinued and his symptoms have completely resolved. He now denies dyspnea, chest pain, palpitations, orthostatic symptoms or syncope.   ECHO 05/04/15 EF 30-35%, Grade 2 DD, Diffuse hypokinesis with akinesis of entire inferolateral/inferior myocardium. Trivial MR  RHC 06/01/15 RA = 5 RV = 37/2/8 PA = 32/9 (21) PCW = 12 Fick cardiac output/index = 6.5/2.6 Thermo cardiac output/index = 6.1/2.5 PVR = 1.5 WU SVR = 1,180  NIBP = 135/89 (101) FA sat = 68%, 71% PA sat = 99%  LHC 10/13/14 LM: Widely patent. LAD: widely patent. LCx: There is only mild disease in the circumflex system. RCA: The stent in the mid vessel is widely patent. There is mild disease in the distal RCA.  EF: 25%, inferior akinesis. LVEDP was 32 mmHg.  Carotid US 11/29/08 Bilateral ICA < 50%  Current Outpatient Prescriptions  Medication Sig Dispense Refill  . aspirin 81 MG tablet Take 1 tablet (81 mg total) by mouth daily.    . clonazePAM (KLONOPIN) 1 MG tablet Take 1 mg by mouth 3 (three) times daily.     . DULoxetine (CYMBALTA) 30 MG capsule Take 30 mg by mouth daily.   4  . furosemide (LASIX) 20 MG tablet Take 1 tablet (20 mg total) by mouth daily. 30 tablet 5  . lamoTRIgine (LAMICTAL) 200 MG tablet Take 200 mg by mouth at bedtime.     Marland Kitchen levocetirizine (XYZAL) 5 MG tablet Take 5 mg  by mouth every evening.     Marland Kitchen losartan (COZAAR) 25 MG tablet Take 1 tablet (25 mg total) by mouth daily. 30 tablet 6  . metoprolol succinate (TOPROL-XL) 25 MG 24 hr tablet Take 1 tablet (25 mg total) by mouth at bedtime. 30 tablet 3  . modafinil (PROVIGIL) 200 MG tablet Take 400 mg by mouth daily. To keep me going per patient    . montelukast (SINGULAIR) 10 MG tablet Take 10 mg by mouth daily.     . nitroGLYCERIN (NITROSTAT) 0.4 MG SL tablet Place 1 tablet (0.4 mg total) under the tongue every 5 (five) minutes as needed for chest pain. 25 tablet 11  . Oxcarbazepine (TRILEPTAL) 300 MG tablet Take 300-900 mg by mouth See admin instructions. Take 2 tablet in the AM and 3 at night  0  . PROAIR HFA 108 (90 BASE) MCG/ACT inhaler Inhale 1-2 puffs into the lungs every 4 (four) hours as needed for wheezing or shortness of breath. Asthma  1  . simvastatin (ZOCOR) 80 MG tablet Take 1 tablet (80 mg total) by mouth daily. 30 tablet 11  . spironolactone (ALDACTONE) 25 MG tablet Take 1 tablet (25 mg total) by mouth daily. 30 tablet 3   No current facility-administered medications for this visit.     Past Medical History  Diagnosis Date  . CAD (coronary artery disease) 2000    PCI to RCA  . OSA (obstructive sleep apnea)   .  Obesity   . Bipolar disorder   . Diverticulitis   . Ischemic cardiomyopathy 10/2014     EF 25% now with ICD- Medtronic  . Acute renal failure 05/12/2015  . Inferior myocardial infarction 2000  . Carotid arterial disease   . ICD (implantable cardioverter-defibrillator) in place 2016    Medtronic    Past Surgical History  Procedure Laterality Date  . Right elbow surgery    . Left heart catheterization with coronary angiogram N/A 10/13/2014    Procedure: LEFT HEART CATHETERIZATION WITH CORONARY ANGIOGRAM;  Surgeon: Corky Crafts, MD;  Location: [facility] CATH LAB;  Service: Cardiovascular;  Laterality: N/A;  . Implantable cardioverter defibrillator implant N/A 12/09/2014     Procedure: IMPLANTABLE CARDIOVERTER DEFIBRILLATOR IMPLANT;  Surgeon: Marinus Maw, MD;  Location: [facility] CATH LAB;  Service: Cardiovascular;  Laterality: N/A;  . Coronary angioplasty with stent placement  2000    RCA stent  . Cardiac catheterization N/A 06/01/2015    Procedure: Right Heart Cath;  Surgeon: Dolores Patty, MD;  Location: [facility] INVASIVE CV LAB;  Service: Cardiovascular;  Laterality: N/A;    Social History   Social History  . Marital Status: Married    Spouse Name: N/A  . Number of Children: 2  . Years of Education: N/A   Occupational History  .     Social History Main Topics  . Smoking status: Former Smoker -- 1.00 packs/day for 18 years    Types: Cigarettes    Start date: 02/28/1981    Quit date: 11/04/1998  . Smokeless tobacco: Current User    Types: Snuff  . Alcohol Use: 0.0 oz/week    0 Standard drinks or equivalent per week     Comment: Rarely  . Drug Use: Not on file  . Sexual Activity: Not on file   Other Topics Concern  . Not on file   Social History Narrative    ROS: no fevers or chills, productive cough, hemoptysis, dysphasia, odynophagia, melena, hematochezia, dysuria, hematuria, rash, seizure activity, orthopnea, PND, pedal edema, claudication. Remaining systems are negative.  Physical Exam: Well-developed well-nourished in no acute distress.  Skin is warm and dry.  HEENT is normal.  Neck is supple.  Chest is clear to auscultation with normal expansion.  Cardiovascular exam is regular rate and rhythm.  Abdominal exam nontender or distended. No masses palpated. Extremities show no edema. neuro grossly intact

## 2015-07-25 NOTE — Patient Instructions (Signed)
Start Losartan 25 mg daily  Labs in 2 weeks  Your physician recommends that you schedule a follow-up appointment in: 1 month

## 2015-07-26 ENCOUNTER — Encounter: Payer: Self-pay | Admitting: Cardiology

## 2015-07-26 ENCOUNTER — Ambulatory Visit (INDEPENDENT_AMBULATORY_CARE_PROVIDER_SITE_OTHER): Payer: BLUE CROSS/BLUE SHIELD | Admitting: Cardiology

## 2015-07-26 VITALS — BP 114/77 | HR 81 | Ht 73.0 in | Wt 282.0 lb

## 2015-07-26 DIAGNOSIS — I429 Cardiomyopathy, unspecified: Secondary | ICD-10-CM | POA: Diagnosis not present

## 2015-07-26 DIAGNOSIS — I5022 Chronic systolic (congestive) heart failure: Secondary | ICD-10-CM | POA: Diagnosis not present

## 2015-07-26 DIAGNOSIS — Z9861 Coronary angioplasty status: Secondary | ICD-10-CM

## 2015-07-26 DIAGNOSIS — Z95810 Presence of automatic (implantable) cardiac defibrillator: Secondary | ICD-10-CM

## 2015-07-26 DIAGNOSIS — I10 Essential (primary) hypertension: Secondary | ICD-10-CM

## 2015-07-26 DIAGNOSIS — E785 Hyperlipidemia, unspecified: Secondary | ICD-10-CM | POA: Diagnosis not present

## 2015-07-26 DIAGNOSIS — I2510 Atherosclerotic heart disease of native coronary artery without angina pectoris: Secondary | ICD-10-CM | POA: Diagnosis not present

## 2015-07-26 DIAGNOSIS — I951 Orthostatic hypotension: Secondary | ICD-10-CM

## 2015-07-26 NOTE — Progress Notes (Signed)
Patient ID: Todd Jackson, male   DOB: 1971-09-06, 44 y.o.   MRN: [id number] Referring Physician: Norma Fredrickson PCP: Micael Hampshire Primary Cardiologist: Jerrell Mylar Primary HF: Bensimhon  HPI:  Todd Jackson is a 44 y.o. male with Chronic combined CHF (Echo 05/04/15 30-35% Gr 2 DD), Ischemic cardiomyopathy, CAD s/p PCI with stenting of RCA in 2000, CKD, OSA, Obesity, Bipolar disorder, DM2 (diagnosed 1 year ago). EF was 50% via cath in 2000.  Echo 10/2014 showed EF 25%, moderate LAE and mild MR.  Cath 12/15 showed no significant CAD and EF 25%.   Had Medtronic ICD with Optivol implanted 2/16.  Started feeling poorly in January. Falling and feeling weak. Found to be orthostatic. Spironolactone and losartan stopped completely in 6/16.  Was admitted on 05/12/15 with AKI. Creatinine ~1.0 -> 4.5.  He was started on midodrine for autonomic neuropathy. BP improved but was off all HF meds except lasix. Underwent RHC 06/01/15 with normal filling pressures and output. Was getting hypertensive and headaches with tid midodrine.  Since last appointment, he saw a neurologist.  He was thought to have gabapentin-induced toxicity.  Gabapentin was stopped, and his orthostatic symptoms resolved.  This has made a big difference.  He feels much better.  No dyspnea on flat ground, no problems with 1 flight of steps.  No lightheadedness with standing.  No chest pain.  No orthopnea/PND.  He is now off midodrine. He wants to go back to work.   Optivol: Fluid index < threshold, stable impedance.   ECHO 05/04/15 EF 30-35%, Grade 2 DD, Diffuse hypokinesis with akinesis of entire inferolateral/inferior myocardium. Trivial MR  RHC 06/01/15 RA = 5 RV = 37/2/8 PA = 32/9 (21) PCW = 12 Fick cardiac output/index = 6.5/2.6 Thermo cardiac output/index = 6.1/2.5 PVR = 1.5 WU SVR = 1,180  NIBP = 135/89 (101) FA sat = 68%, 71% PA sat = 99%  LHC 10/13/14 LM: Widely patent. LAD: widely patent. LCx: There is only mild  disease in the circumflex system. RCA: The stent in the mid vessel is widely patent. There is mild disease in the distal RCA.  EF: 25%, inferior akinesis. LVEDP was 32 mmHg.  Carotid US 11/29/08 Bilateral ICA < 50%   Current Outpatient Prescriptions  Medication Sig Dispense Refill  . aspirin 81 MG tablet Take 1 tablet (81 mg total) by mouth daily.    . clonazePAM (KLONOPIN) 1 MG tablet Take 1 mg by mouth 3 (three) times daily.     . furosemide (LASIX) 20 MG tablet Take 1 tablet (20 mg total) by mouth daily. 30 tablet 5  . gabapentin (NEURONTIN) 300 MG capsule Take 300 mg by mouth 2 (two) times daily.    Marland Kitchen lamoTRIgine (LAMICTAL) 200 MG tablet Take 200 mg by mouth at bedtime.     Marland Kitchen levocetirizine (XYZAL) 5 MG tablet Take 5 mg by mouth every evening.     . metoprolol succinate (TOPROL-XL) 25 MG 24 hr tablet Take 1 tablet (25 mg total) by mouth at bedtime. 30 tablet 3  . modafinil (PROVIGIL) 200 MG tablet Take 400 mg by mouth daily. To keep me going per patient    . montelukast (SINGULAIR) 10 MG tablet Take 10 mg by mouth daily.     . nitroGLYCERIN (NITROSTAT) 0.4 MG SL tablet Place 1 tablet (0.4 mg total) under the tongue every 5 (five) minutes as needed for chest pain. 25 tablet 11  . Oxcarbazepine (TRILEPTAL) 300 MG tablet Take 300-900 mg by mouth See admin  instructions. Take 2 tablet in the AM and 3 at night  0  . PROAIR HFA 108 (90 BASE) MCG/ACT inhaler Inhale 1-2 puffs into the lungs every 4 (four) hours as needed for wheezing or shortness of breath. Asthma  1  . simvastatin (ZOCOR) 80 MG tablet Take 1 tablet (80 mg total) by mouth daily. 30 tablet 11  . spironolactone (ALDACTONE) 25 MG tablet Take 1 tablet (25 mg total) by mouth daily. 30 tablet 3  . losartan (COZAAR) 25 MG tablet Take 1 tablet (25 mg total) by mouth daily. 30 tablet 6  . midodrine (PROAMATINE) 2.5 MG tablet Take 1 tablet (2.5 mg total) by mouth 3 (three) times daily with meals. (Patient not taking: Reported on  07/25/2015) 90 tablet 3   No current facility-administered medications for this encounter.   Filed Vitals:   07/25/15 1422  BP: 132/80  Pulse: 98  Weight: 281 lb 12.8 oz (127.824 kg)  SpO2: 97%    PHYSICAL EXAM: General: Appears older than stated age. Obese HEENT: normal Neck: supple. Thick, JVP flat. Carotids 2+ bilat; no bruits. No lymphadenopathy or thryomegaly appreciated. Cor: PMI nondisplaced. Tachycardic Irregular rate & rhythm. No rubs, gallops or murmurs. Lungs: CTA Abdomen: Obese, nontender, distended. No hepatosplenomegaly. No bruits or masses. Good bowel sounds. Extremities: no cyanosis, clubbing, rash, edema Neuro: alert & oriented x 3, cranial nerves grossly intact. moves all 4 extremities w/o difficulty. Affect pleasant.  ASSESSMENT & PLAN:  1. Chronic combined HF - EF 30-35%, Grade 2 DD, inferolateral akinesis   --s/p SJ ICD - Sees Dr. Ladona Ridgel 2. Cardiomyopathy, ischemic 3. CAD without angina 4. Orthostatic hypotension 5. DM2 6. HLD 7. Bipolar d/o  Marked improvement compared to prior appointment.  Orthostatic symptoms have completely resolved since stopping gabapentin and he is off midodrine.  Per his neurologist, he appears to have had gabapentin toxicity.  Continue Toprol XL 25 daily and spironolactone 25 daily.  Will add back losartan starting at 25 mg daily.  BMET/BNP in 2 wks. Followup 1 month.   Dalton McLean,MD 07/26/2015

## 2015-07-26 NOTE — Assessment & Plan Note (Signed)
Patient is euvolemic on examination. Continue present dose of diuretics. 

## 2015-07-26 NOTE — Assessment & Plan Note (Signed)
Continue statin. 

## 2015-07-26 NOTE — Assessment & Plan Note (Signed)
Blood pressure controlled. 

## 2015-07-26 NOTE — Assessment & Plan Note (Signed)
Patient's orthostatic symptoms have completely resolved after discontinuing gabapentin. We will continue Toprol, Cozaar, Lasix and spironolactone. He is scheduled to have his potassium and renal function checked in 2 weeks. I will not advance his heart failure medications given recent orthostasis and borderline blood pressure.

## 2015-07-26 NOTE — Assessment & Plan Note (Signed)
Followed by electrophysiology. 

## 2015-07-26 NOTE — Assessment & Plan Note (Signed)
Continue aspirin and statin. 

## 2015-07-26 NOTE — Assessment & Plan Note (Signed)
Felt secondary to gabapentin toxicity. Symptoms have completely resolved off of this medication.

## 2015-07-26 NOTE — Patient Instructions (Signed)
Your physician recommends that you schedule a follow-up appointment in: 3 MONTHS WITH DR CRENSHAW  

## 2015-08-07 ENCOUNTER — Ambulatory Visit (INDEPENDENT_AMBULATORY_CARE_PROVIDER_SITE_OTHER): Payer: BLUE CROSS/BLUE SHIELD | Admitting: *Deleted

## 2015-08-07 DIAGNOSIS — I429 Cardiomyopathy, unspecified: Secondary | ICD-10-CM

## 2015-08-07 NOTE — Progress Notes (Signed)
Remote ICD transmission.   

## 2015-08-08 LAB — CUP PACEART REMOTE DEVICE CHECK
HighPow Impedance: 80 Ohm
Lead Channel Pacing Threshold Pulse Width: 0.4 ms
Lead Channel Sensing Intrinsic Amplitude: 27.25 mV
Lead Channel Sensing Intrinsic Amplitude: 27.25 mV
Lead Channel Setting Pacing Pulse Width: 0.4 ms
MDC IDC MSMT BATTERY REMAINING LONGEVITY: 134 mo
MDC IDC MSMT BATTERY VOLTAGE: 3.05 V
MDC IDC MSMT LEADCHNL RV IMPEDANCE VALUE: 532 Ohm
MDC IDC MSMT LEADCHNL RV IMPEDANCE VALUE: 551 Ohm
MDC IDC MSMT LEADCHNL RV PACING THRESHOLD AMPLITUDE: 0.875 V
MDC IDC SESS DTM: 20161003092604
MDC IDC SET LEADCHNL RV PACING AMPLITUDE: 2 V
MDC IDC SET LEADCHNL RV SENSING SENSITIVITY: 0.3 mV
MDC IDC SET ZONE DETECTION INTERVAL: 300 ms
MDC IDC SET ZONE DETECTION INTERVAL: 450 ms
MDC IDC STAT BRADY RV PERCENT PACED: 0.01 %
Zone Setting Detection Interval: 360 ms

## 2015-08-10 ENCOUNTER — Encounter: Payer: Self-pay | Admitting: Internal Medicine

## 2015-08-16 ENCOUNTER — Encounter: Payer: Self-pay | Admitting: Cardiology

## 2015-08-21 ENCOUNTER — Telehealth: Payer: Self-pay

## 2015-08-21 NOTE — Telephone Encounter (Signed)
Attempted patient call regarding if he wants to continue ICM follow up since being established at CHF Clinic with Dr Gala RomneyBensimhon.

## 2015-08-22 ENCOUNTER — Other Ambulatory Visit: Payer: Self-pay | Admitting: Cardiology

## 2015-08-23 LAB — BASIC METABOLIC PANEL
BUN/Creatinine Ratio: 10 (ref 9–20)
BUN: 14 mg/dL (ref 6–24)
CALCIUM: 9.7 mg/dL (ref 8.7–10.2)
CHLORIDE: 99 mmol/L (ref 97–106)
CO2: 23 mmol/L (ref 18–29)
Creatinine, Ser: 1.37 mg/dL — ABNORMAL HIGH (ref 0.76–1.27)
GFR, EST AFRICAN AMERICAN: 71 mL/min/{1.73_m2} (ref >59)
GFR, EST NON AFRICAN AMERICAN: 61 mL/min/{1.73_m2} (ref >59)
Glucose: 96 mg/dL (ref 65–99)
POTASSIUM: 4.2 mmol/L (ref 3.5–5.2)
SODIUM: 140 mmol/L (ref 136–144)

## 2015-08-23 LAB — PRO B NATRIURETIC PEPTIDE: NT-PRO BNP: 136 pg/mL — AB (ref 0–121)

## 2015-08-23 NOTE — Telephone Encounter (Signed)
Attempted call to patient at home and mobile number.  Wife answered home phone and stated he was working but could try him on mobile phone.  No answer on mobile.

## 2015-08-24 NOTE — Telephone Encounter (Signed)
Patient returned call.  He stated Dr Todd Jackson will be following him for his fluid levels and CHF.   He declined ICM program at this time and explained he is welcome to join the program at a later date.

## 2015-08-28 ENCOUNTER — Ambulatory Visit (HOSPITAL_COMMUNITY)
Admission: RE | Admit: 2015-08-28 | Discharge: 2015-08-28 | Disposition: A | Discharge status: Home / Self Care | Payer: BLUE CROSS/BLUE SHIELD | Source: Ambulatory Visit | Attending: Cardiology | Admitting: Cardiology

## 2015-08-28 ENCOUNTER — Encounter: Payer: Self-pay | Admitting: Internal Medicine

## 2015-08-28 ENCOUNTER — Encounter (HOSPITAL_COMMUNITY): Payer: Self-pay

## 2015-08-28 VITALS — BP 128/78 | Ht 73.0 in | Wt 269.8 lb

## 2015-08-28 DIAGNOSIS — I951 Orthostatic hypotension: Secondary | ICD-10-CM | POA: Insufficient documentation

## 2015-08-28 DIAGNOSIS — Z9861 Coronary angioplasty status: Secondary | ICD-10-CM

## 2015-08-28 DIAGNOSIS — N189 Chronic kidney disease, unspecified: Secondary | ICD-10-CM | POA: Diagnosis not present

## 2015-08-28 DIAGNOSIS — E785 Hyperlipidemia, unspecified: Secondary | ICD-10-CM | POA: Diagnosis not present

## 2015-08-28 DIAGNOSIS — E669 Obesity, unspecified: Secondary | ICD-10-CM | POA: Diagnosis not present

## 2015-08-28 DIAGNOSIS — F319 Bipolar disorder, unspecified: Secondary | ICD-10-CM | POA: Insufficient documentation

## 2015-08-28 DIAGNOSIS — Z95810 Presence of automatic (implantable) cardiac defibrillator: Secondary | ICD-10-CM | POA: Insufficient documentation

## 2015-08-28 DIAGNOSIS — Z955 Presence of coronary angioplasty implant and graft: Secondary | ICD-10-CM | POA: Diagnosis not present

## 2015-08-28 DIAGNOSIS — I255 Ischemic cardiomyopathy: Secondary | ICD-10-CM | POA: Diagnosis not present

## 2015-08-28 DIAGNOSIS — I5022 Chronic systolic (congestive) heart failure: Secondary | ICD-10-CM | POA: Insufficient documentation

## 2015-08-28 DIAGNOSIS — I2510 Atherosclerotic heart disease of native coronary artery without angina pectoris: Secondary | ICD-10-CM | POA: Insufficient documentation

## 2015-08-28 DIAGNOSIS — G4733 Obstructive sleep apnea (adult) (pediatric): Secondary | ICD-10-CM | POA: Insufficient documentation

## 2015-08-28 DIAGNOSIS — E1122 Type 2 diabetes mellitus with diabetic chronic kidney disease: Secondary | ICD-10-CM | POA: Insufficient documentation

## 2015-08-28 DIAGNOSIS — Z79899 Other long term (current) drug therapy: Secondary | ICD-10-CM | POA: Insufficient documentation

## 2015-08-28 DIAGNOSIS — E1140 Type 2 diabetes mellitus with diabetic neuropathy, unspecified: Secondary | ICD-10-CM | POA: Diagnosis not present

## 2015-08-28 DIAGNOSIS — Z7982 Long term (current) use of aspirin: Secondary | ICD-10-CM | POA: Insufficient documentation

## 2015-08-28 MED ORDER — METOPROLOL SUCCINATE ER 25 MG PO TB24: 25.0000 mg | ORAL_TABLET | Freq: Two times a day (BID) | ORAL | Status: DC

## 2015-08-28 NOTE — Progress Notes (Signed)
Patient ID: Todd SakeJoseph Ferrari, male   DOB: [birth year], 44 y.o.   MRN: [id number] Referring Physician: Norma FredricksonLori Gerhardt PCP: Micael HampshireKathy Brown  HPI:  Todd Jackson is a 44 y.o. male with chronic systolic CHF (Echo 05/04/15 30-35% Gr 2 DD), Ischemic cardiomyopathy, CAD s/p PCI with stenting of RCA in 2000, CKD, OSA, Obesity, Bipolar disorder, DM2 (diagnosed 1 year ago). EF was 50% via cath in 2000.  Echo 10/2014 showed EF 25%, moderate LAE and mild MR.  Cath 12/15 showed no significant CAD and EF 25%.   Had Medtronic ICD with Optivol implanted 2/16.  Started feeling poorly in January. Falling and feeling weak. Found to be orthostatic. Spironolactone and losartan stopped completely in 6/16.  Was admitted on 05/12/15 with AKI. Creatinine ~1.0 -> 4.5.  He was started on midodrine for autonomic neuropathy. BP improved but was off all HF meds except lasix. Underwent RHC 06/01/15 with normal filling pressures and output. Was getting hypertensive and headaches with tid midodrine.  He then saw a neurologist.  He was thought to have gabapentin-induced toxicity.  Gabapentin was stopped, and his orthostatic symptoms resolved.  This has made a big difference.  He feels much better.  No dyspnea on flat ground, no problems with 1 flight of steps.  No lightheadedness with standing.  No chest pain.  No orthopnea/PND.  He is back at work.  He tolerated restarting losartan with no problem.  Good energy level. Weight is down 12 lbs, corresponding to more activity.   He does have more tingling in his feet from diabetic neuropathy since stopping gabapentin, but this is tolerable.   Optivol: Fluid index < threshold, stable impedance.   Labs (2/16): LDL 56, HDL 27 Labs (10/16): K 4.2, creatinine 1.37, NT-proBNP 136  ECHO 05/04/15 EF 30-35%, Grade 2 DD, Diffuse hypokinesis with akinesis of entire inferolateral/inferior myocardium. Trivial MR  RHC 06/01/15 RA = 5 RV = 37/2/8 PA = 32/9 (21) PCW = 12 Fick cardiac output/index =  6.5/2.6 Thermo cardiac output/index = 6.1/2.5 PVR = 1.5 WU SVR = 1,180  NIBP = 135/89 (101) FA sat = 68%, 71% PA sat = 99%  LHC 10/13/14 LM: Widely patent. LAD: widely patent. LCx: There is only mild disease in the circumflex system. RCA: The stent in the mid vessel is widely patent. There is mild disease in the distal RCA.  EF: 25%, inferior akinesis. LVEDP was 32 mmHg.  Carotid US 11/29/08 Bilateral ICA < 50%   Current Outpatient Prescriptions  Medication Sig Dispense Refill  . aspirin 81 MG tablet Take 1 tablet (81 mg total) by mouth daily.    . clonazePAM (KLONOPIN) 1 MG tablet Take 1 mg by mouth 3 (three) times daily.     . furosemide (LASIX) 20 MG tablet Take 1 tablet (20 mg total) by mouth daily. 30 tablet 5  . lamoTRIgine (LAMICTAL) 200 MG tablet Take 200 mg by mouth at bedtime.     Marland Kitchen. levocetirizine (XYZAL) 5 MG tablet Take 5 mg by mouth every evening.     Marland Kitchen. losartan (COZAAR) 25 MG tablet Take 1 tablet (25 mg total) by mouth daily. 30 tablet 6  . modafinil (PROVIGIL) 200 MG tablet Take 400 mg by mouth daily. To keep me going per patient    . montelukast (SINGULAIR) 10 MG tablet Take 10 mg by mouth daily.     . nitroGLYCERIN (NITROSTAT) 0.4 MG SL tablet Place 1 tablet (0.4 mg total) under the tongue every 5 (five) minutes as needed for chest  pain. 25 tablet 11  . Oxcarbazepine (TRILEPTAL) 300 MG tablet Take 300-900 mg by mouth See admin instructions. Take 1 tablet in the AM and 3 at night  0  . PROAIR HFA 108 (90 BASE) MCG/ACT inhaler Inhale 1-2 puffs into the lungs every 4 (four) hours as needed for wheezing or shortness of breath. Asthma  1  . simvastatin (ZOCOR) 80 MG tablet Take 1 tablet (80 mg total) by mouth daily. 30 tablet 11  . spironolactone (ALDACTONE) 25 MG tablet Take 1 tablet (25 mg total) by mouth daily. 30 tablet 3  . metoprolol succinate (TOPROL XL) 25 MG 24 hr tablet Take 1 tablet (25 mg total) by mouth 2 (two) times daily. 60 tablet 3   No current  facility-administered medications for this encounter.   Filed Vitals:   08/28/15 1056  BP: 128/78  Height:  (1.854 m)  Weight: 269 lb 12.8 oz (122.38 kg)    PHYSICAL EXAM: General: NAD HEENT: normal Neck: supple. Thick, JVP flat. Carotids 2+ bilat; no bruits. No lymphadenopathy or thryomegaly appreciated. Cor: PMI nondisplaced. Regular rate & rhythm. No rubs, gallops or murmurs. Lungs: CTA Abdomen: Obese, nontender, distended. No hepatosplenomegaly. No bruits or masses. Good bowel sounds. Extremities: no cyanosis, clubbing, rash, edema Neuro: alert & oriented x 3, cranial nerves grossly intact. moves all 4 extremities w/o difficulty. Affect pleasant.  ASSESSMENT & PLAN:  1. Chronic systolic CHF: EF 11-91%, Grade 2 DD, inferolateral akinesis on 8/16 echo.  Ischemic cardiomyopathy. s/p Medtronic ICD - Sees Dr. Ladona Ridgel. Euvolemic by exam and Optivol.   - Continue current spironolactone and losartan.  - I will have him increase Toprol XL to 25 mg bid.  Recent creatinine stable.  2. Cardiomyopathy, ischemic 3. CAD without angina: Continue ASA 81 and statin, good lipids earlier this year.  4. Orthostatic hypotension: Marked improvement.  Orthostatic symptoms have completely resolved since stopping gabapentin and he is off midodrine.  Per his neurologist, he appears to have had gabapentin toxicity. 5. DM2 6. HLD 7. Bipolar d/o  Followup in 2 months.   Baleigh Rennaker,MD 08/28/2015

## 2015-08-28 NOTE — Patient Instructions (Signed)
Increase metoprolol 25 mg twice a day  Follow up in 2 months

## 2015-08-30 ENCOUNTER — Encounter: Payer: Self-pay | Admitting: Internal Medicine

## 2015-09-15 ENCOUNTER — Telehealth: Payer: Self-pay | Admitting: Physician Assistant

## 2015-09-15 NOTE — Telephone Encounter (Signed)
FMLA never picked up by patient [facility]

## 2015-09-19 ENCOUNTER — Encounter: Payer: Self-pay | Admitting: Cardiology

## 2015-09-22 ENCOUNTER — Telehealth: Payer: Self-pay | Admitting: Cardiology

## 2015-09-22 NOTE — Telephone Encounter (Signed)
Received [facility] from [facility] for appointment on 10/11/15 with Dr Jens Somrenshaw.  [facility] given to [facility] (medical records) for Dr Ludwig Clarksrenshaw's schedule on 10/11/15. lp

## 2015-09-26 ENCOUNTER — Encounter: Payer: Self-pay | Admitting: Cardiology

## 2015-10-11 ENCOUNTER — Ambulatory Visit: Payer: BLUE CROSS/BLUE SHIELD | Admitting: Cardiology

## 2015-11-07 ENCOUNTER — Ambulatory Visit (INDEPENDENT_AMBULATORY_CARE_PROVIDER_SITE_OTHER): Payer: BLUE CROSS/BLUE SHIELD | Admitting: *Deleted

## 2015-11-07 DIAGNOSIS — I255 Ischemic cardiomyopathy: Secondary | ICD-10-CM

## 2015-11-07 NOTE — Progress Notes (Signed)
Remote ICD transmission.   

## 2015-11-09 LAB — CUP PACEART REMOTE DEVICE CHECK
Date Time Interrogation Session: 20170103142204
HIGH POWER IMPEDANCE MEASURED VALUE: 66 Ohm
Implantable Lead Implant Date: 20160205
Lead Channel Impedance Value: 513 Ohm
Lead Channel Pacing Threshold Amplitude: 0.875 V
Lead Channel Pacing Threshold Pulse Width: 0.4 ms
Lead Channel Sensing Intrinsic Amplitude: 24.625 mV
Lead Channel Setting Sensing Sensitivity: 0.3 mV
MDC IDC LEAD LOCATION: 753860
MDC IDC LEAD MODEL: 181
MDC IDC LEAD SERIAL: 330890
MDC IDC MSMT BATTERY REMAINING LONGEVITY: 133 mo
MDC IDC MSMT BATTERY VOLTAGE: 3.04 V
MDC IDC MSMT LEADCHNL RV IMPEDANCE VALUE: 513 Ohm
MDC IDC MSMT LEADCHNL RV SENSING INTR AMPL: 24.625 mV
MDC IDC SET LEADCHNL RV PACING AMPLITUDE: 2 V
MDC IDC SET LEADCHNL RV PACING PULSEWIDTH: 0.4 ms
MDC IDC STAT BRADY RV PERCENT PACED: 0.01 %

## 2015-11-10 ENCOUNTER — Encounter: Payer: Self-pay | Admitting: Cardiology

## 2015-11-23 ENCOUNTER — Other Ambulatory Visit (HOSPITAL_COMMUNITY): Payer: Self-pay | Admitting: *Deleted

## 2015-11-23 MED ORDER — METOPROLOL SUCCINATE ER 25 MG PO TB24: 25.0000 mg | ORAL_TABLET | Freq: Two times a day (BID) | ORAL | Status: DC

## 2015-11-23 NOTE — Telephone Encounter (Signed)
Per pt's wife request, 30 day supply of Metoprolol sent to [facility] and 90 supply sent to [facility]

## 2015-11-30 ENCOUNTER — Other Ambulatory Visit (HOSPITAL_COMMUNITY): Payer: Self-pay | Admitting: Internal Medicine

## 2015-12-01 ENCOUNTER — Other Ambulatory Visit (HOSPITAL_COMMUNITY): Payer: Self-pay | Admitting: *Deleted

## 2015-12-01 MED ORDER — SPIRONOLACTONE 25 MG PO TABS: 25.0000 mg | ORAL_TABLET | Freq: Every day | ORAL | Status: DC

## 2015-12-12 ENCOUNTER — Telehealth (HOSPITAL_COMMUNITY): Payer: Self-pay | Admitting: Vascular Surgery

## 2015-12-12 NOTE — Telephone Encounter (Signed)
Returned pt call  To make f/u appt

## 2016-01-02 ENCOUNTER — Encounter (HOSPITAL_COMMUNITY): Payer: Self-pay | Admitting: Psychiatry

## 2016-01-02 ENCOUNTER — Ambulatory Visit (INDEPENDENT_AMBULATORY_CARE_PROVIDER_SITE_OTHER): Payer: BLUE CROSS/BLUE SHIELD | Admitting: Psychiatry

## 2016-01-02 VITALS — BP 117/75 | HR 72 | Ht 73.0 in | Wt 257.0 lb

## 2016-01-02 DIAGNOSIS — F4310 Post-traumatic stress disorder, unspecified: Secondary | ICD-10-CM

## 2016-01-02 DIAGNOSIS — F3132 Bipolar disorder, current episode depressed, moderate: Secondary | ICD-10-CM | POA: Diagnosis not present

## 2016-01-02 DIAGNOSIS — F411 Generalized anxiety disorder: Secondary | ICD-10-CM | POA: Diagnosis not present

## 2016-01-02 MED ORDER — ESCITALOPRAM OXALATE 5 MG PO TABS: 5.0000 mg | ORAL_TABLET | Freq: Every day | ORAL | Status: DC

## 2016-01-02 MED ORDER — LAMOTRIGINE 150 MG PO TABS: 300.0000 mg | ORAL_TABLET | Freq: Every day | ORAL | Status: DC

## 2016-01-02 MED ORDER — OXCARBAZEPINE 300 MG PO TABS: 300.0000 mg | ORAL_TABLET | ORAL | Status: DC

## 2016-01-02 NOTE — Progress Notes (Signed)
Psychiatric Initial Adult Assessment   Patient Identification: Todd Jackson MRN:  [id number] Date of Evaluation:  01/02/2016 Referral Source: Self  Chief Complaint:   Chief Complaint    Establish Care     Visit Diagnosis:    ICD-9-CM ICD-10-CM   1. Bipolar affective disorder, currently depressed, moderate (HCC) 296.52 F31.32   2. PTSD (post-traumatic stress disorder) 309.81 F43.10   3. GAD (generalized anxiety disorder) 300.02 F41.1    Diagnosis:   Patient Active Problem List   Diagnosis Date Noted  . Orthostatic hypotension [I95.1] 06/01/2015  . Acute renal insufficiency [N28.9] 05/12/2015  . Essential hypertension [I10] 12/14/2014  . S/P MDT ICD Feb 2016 [Z95.810] 12/09/2014  . Chronic systolic heart failure (HCC) [I50.22] 11/16/2014  . Ischemic cardiomyopathy [I25.5]   . Cardiomyopathy- apparently non ischemic based on cath Dec 2015 [I42.9] 10/20/2013  . Hyperlipidemia [E78.5] 10/20/2013  . CAD S/P reomte CA PCI 2000, patent Dec 2015 [I25.10, Z98.61]   . OSA (obstructive sleep apnea) [G47.33]   . Obesity [E66.9]   . Bipolar disorder (HCC) [F31.9]    History of Present Illness:  45 years old currently married Caucasian male who is here with his wife referred by himself. He has been seeing a psychiatrist in the more treatment center for the last 8-9 years but want to change the provider and adjust medications. States that his last psychiatrist also retired and that he started falling with the nurse practitioner  He is diagnosed with bipolar, PTSD. He has multiple medical conditions including heart problems and is also using a sleep apnea machine. Presents with history of mood swings including irritability when he is not on medication and ups and downs. His highs are described as increased energy increased distraction cannot finish projects. Decreased need for sleep increased irritability. Then he exhaust himself and then goes into a depression that withdrawn a motivation and  excessive sleepiness. Recently has been increasing his dose of Klonopin says that he is having anxiety cannot finish his job or feels worried about it and this is leading to increased dose of Klonopin otherwise he has not used it that regularly prior to 2 months. Patient does endorse excessive worries, unreasonable at times he worries about his health he worries about his family about his future. Patient states to have nightmares related to his flashbacks about first [place] war in 1990s. Says that he has been on Celexa but cause sexual dysfunction so he stopped it. Otherwise there are triggers that remind him and has flashbacks otherwise he does not have flashbacks on a day-to-day basis Has been using more and frequent klnopine for last 2 months.  Aggravating factors; heart condition. History of active combat. 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 Supportive wife.  Location; Mood swings, anxiety difficulty sleeping. Context; history of active combat. Family history of psychiatric conditions including bipolar Severity of depression; 5 out of 10. 10 being no depression Timing: if off meds he gets more irritable.   Associated Signs/Symptoms: Depression Symptoms:  difficulty concentrating, anxiety, disturbed sleep, (Hypo) Manic Symptoms:  Distractibility, Anxiety Symptoms:  Excessive Worry, Psychotic Symptoms:  denies PTSD Symptoms: Had a traumatic exposure:  active [place] war combat Hypervigilance:  Yes Hyperarousal:  Difficulty Concentrating Emotional Numbness/Detachment Irritability/Anger Sleep Past Psychiatric History:  Mostly by outpatient psychiatrist his psychiatrist retired. He has been followed with [facility] treatment [facility] for bipolar, PTSD. No prior psychiatric admission or suicide attempt Risperdal did not help he got consistent with having diabetes.  Past Medical History:  Past Medical History  Diagnosis Date  . CAD (coronary artery  disease) 2000    PCI to RCA  . OSA (obstructive sleep apnea)   . Obesity   . Bipolar disorder (HCC)   . Diverticulitis   . Ischemic cardiomyopathy 10/2014     EF 25% now with ICD- Medtronic  . Acute renal failure (HCC) 05/12/2015  . Inferior myocardial infarction (HCC) 2000  . Carotid arterial disease (HCC)   . ICD (implantable cardioverter-defibrillator) in place 2016    Medtronic    Past Surgical History  Procedure Laterality Date  . Right elbow surgery    . Left heart catheterization with coronary angiogram N/A 10/13/2014    Procedure: LEFT HEART CATHETERIZATION WITH CORONARY ANGIOGRAM;  Surgeon: Corky Crafts, MD;  Location: [facility] CATH LAB;  Service: Cardiovascular;  Laterality: N/A;  . Implantable cardioverter defibrillator implant N/A 12/09/2014    Procedure: IMPLANTABLE CARDIOVERTER DEFIBRILLATOR IMPLANT;  Surgeon: Marinus Maw, MD;  Location[facility] CATH LAB;  Service: Cardiovascular;  Laterality: N/A;  . Coronary angioplasty with stent placement  2000    RCA stent  . Cardiac catheterization N/A 06/01/2015    Procedure: Right Heart Cath;  Surgeon: Dolores Patty, MD;  Location: [facility] INVASIVE CV LAB;  Service: Cardiovascular;  Laterality: N/A;   Family History:  Family History  Problem Relation Age of Onset  . CAD Mother     MI at age 25  . Alcohol abuse Mother   . Colon cancer Father   . Diabetes Father    Social History:   Social History   Social History  . Marital Status: Married    Spouse Name: N/A  . Number of Children: 2  . Years of Education: N/A   Occupational History  .     Social History Main Topics  . Smoking status: Former Smoker -- 1.00 packs/day for 18 years    Types: Cigarettes    Start date: 02/28/1981    Quit date: 11/04/1998  . Smokeless tobacco: Current User    Types: Snuff  . Alcohol Use: 0.0 oz/week    0 Standard drinks or equivalent per week     Comment: Rarely  . Drug Use: None  . Sexual Activity: Not Asked   Other Topics Concern   . None   Social History Narrative   Additional Social History: Grew up with his parents. His dad had bipolar. He would hear voices and would difficult be at times. Mom was an alcoholic and parents would be rough with him at times. Patient had a sibling sister at age 77 he left home. He served in marriage for 5 years active combat. He has also been doing the work currently is working as Recruitment consultant. Marital last 25 years has a daughter 57 years marriage is going on fine  Musculoskeletal: Strength & Muscle Tone: decreased Gait & Station: unsteady Patient leans: Left  Psychiatric Specialty Exam: HPI  Review of Systems  Constitutional: Negative for fever.  Cardiovascular: Negative for chest pain.  Skin: Negative for rash.  Neurological: Negative for tremors.  Psychiatric/Behavioral: The patient is nervous/anxious.     Blood pressure 117/75, pulse 72, height 6\' 1"  (1.854 m), weight 257 lb (116.574 kg).Body mass index is 33.91 kg/(m^2).  General Appearance: Casual  Eye Contact:  Fair  Speech:  Normal Rate  Volume:  Normal  Mood:  Euthymic  Affect:  Congruent  Thought Process:  Coherent with some circumstantiality  Orientation:  Full (Time, Place, and Person)  Thought Content:  Rumination  Suicidal Thoughts:  No  Homicidal Thoughts:  No  Memory:  Immediate;   Fair Recent;   Fair  Judgement:  Fair  Insight:  Shallow  Psychomotor Activity:  Normal  Concentration:  Fair  Recall:  [employer] of Knowledge:Fair  Language: Good  Akathisia:  Negative  Handed:  Right  AIMS (if indicated):    Assets:  Desire for Improvement Social Support  ADL's:  Intact  Cognition: WNL  Sleep:  variable   Is the patient at risk to self?  No. Has the patient been a risk to self in the past 6 months?  No. Has the patient been a risk to self within the distant past?  No. Is the patient a risk to others?  No. Has the patient been a risk to others in the past 6 months?   No. Has the patient been a risk to others within the distant past?  No.  Allergies:   Allergies  Allergen Reactions  . Gabapentin Other (See Comments)  . Tape Dermatitis    PLEASE USE PAPER TAPE PLEASE USE PAPER TAPE   Current Medications: Current Outpatient Prescriptions  Medication Sig Dispense Refill  . aspirin 81 MG tablet Take 1 tablet (81 mg total) by mouth daily.    . clonazePAM (KLONOPIN) 1 MG tablet Take 1 mg by mouth 3 (three) times daily.     Marland Kitchen escitalopram (LEXAPRO) 5 MG tablet Take 1 tablet (5 mg total) by mouth daily. 30 tablet 0  . furosemide (LASIX) 20 MG tablet Take 1 tablet (20 mg total) by mouth daily. 30 tablet 5  . lamoTRIgine (LAMICTAL) 150 MG tablet Take 2 tablets (300 mg total) by mouth at bedtime. 60 tablet 0  . levocetirizine (XYZAL) 5 MG tablet Take 5 mg by mouth every evening.     Marland Kitchen losartan (COZAAR) 25 MG tablet Take 1 tablet (25 mg total) by mouth daily. 30 tablet 6  . metoprolol succinate (TOPROL XL) 25 MG 24 hr tablet Take 1 tablet (25 mg total) by mouth 2 (two) times daily. 180 tablet 2  . modafinil (PROVIGIL) 200 MG tablet Take 400 mg by mouth daily. To keep me going per patient    . montelukast (SINGULAIR) 10 MG tablet Take 10 mg by mouth daily.     . nitroGLYCERIN (NITROSTAT) 0.4 MG SL tablet Place 1 tablet (0.4 mg total) under the tongue every 5 (five) minutes as needed for chest pain. 25 tablet 11  . Oxcarbazepine (TRILEPTAL) 300 MG tablet Take 1-3 tablets (300-900 mg total) by mouth See admin instructions. Take 2 tablet in the AM and 3 at night 150 tablet 0  . PROAIR HFA 108 (90 BASE) MCG/ACT inhaler Inhale 1-2 puffs into the lungs every 4 (four) hours as needed for wheezing or shortness of breath. Asthma  1  . simvastatin (ZOCOR) 80 MG tablet Take 1 tablet (80 mg total) by mouth daily. 30 tablet 11  . spironolactone (ALDACTONE) 25 MG tablet Take 1 tablet (25 mg total) by mouth daily. 30 tablet 3   No current facility-administered medications for  this visit.    Previous Psychotropic Medications: Yes  Risperdal: concern of diabetes Gabapentin: says his nerves got toxic with it.  Substance Abuse History in the last 12 months:  No.  Consequences of Substance Abuse: NA  Medical Decision Making:  Review of Psycho-Social Stressors (1), Decision to obtain old records (1), Review of Medication Regimen &  Side Effects (2) and Review of New Medication or Change in Dosage (2)  Treatment Plan Summary: Medication management and Plan as follows  Mood disorder /Bipolar disorder; cyclic in nature. Recommend increase Lamictal to  per day divided. No rash reported. Continue trileptal  2 in the am and 3 at night. Prescriptions sent.  If needed we will adjust further. His mood disorder is somewhat complicated because he has underlying medical conditions history of PTSD that can also be contributing to his mood symptoms.  GAD: Had small dose of Lexapro 5 mg after 4 days of increased dose of Lamictal. I advised to cut down the Klonopin he has been recently taking it more often. He will cut down slowly. Prior to 2 months he was not on on the regular dose but since the last 2 months he has been feeling somewhat anxious and trying to take it more often than needed. We talked about tolerance and dependence and complications of being on Klonopin for long-term Non smoker Denies using drugs or alcohol,. PTSD; Lexapro 5 mg as above Follow up with other providers. More than 50% time spent in counseling and coordination of care including patient education, sleep hygiene. He does use a sleep apnea machine Call 911 or report local emergency room for any urgent concerns or suicidal thoughts Medication side effects and concerns explained wife was here agreed with the plan Follow-up in 3-4 weeks or earlier if needed    Dezarae Mcclaran 2/28/20179:45 AM

## 2016-01-15 ENCOUNTER — Encounter (HOSPITAL_COMMUNITY): Payer: Self-pay

## 2016-01-15 ENCOUNTER — Ambulatory Visit (HOSPITAL_COMMUNITY)
Admission: RE | Admit: 2016-01-15 | Discharge: 2016-01-15 | Disposition: A | Discharge status: Home / Self Care | Payer: BLUE CROSS/BLUE SHIELD | Source: Ambulatory Visit | Attending: Cardiology | Admitting: Cardiology

## 2016-01-15 VITALS — BP 118/72 | HR 94 | Wt 265.0 lb

## 2016-01-15 DIAGNOSIS — F319 Bipolar disorder, unspecified: Secondary | ICD-10-CM | POA: Insufficient documentation

## 2016-01-15 DIAGNOSIS — Z95810 Presence of automatic (implantable) cardiac defibrillator: Secondary | ICD-10-CM | POA: Diagnosis not present

## 2016-01-15 DIAGNOSIS — E669 Obesity, unspecified: Secondary | ICD-10-CM | POA: Diagnosis not present

## 2016-01-15 DIAGNOSIS — I5022 Chronic systolic (congestive) heart failure: Secondary | ICD-10-CM | POA: Diagnosis present

## 2016-01-15 DIAGNOSIS — Z7982 Long term (current) use of aspirin: Secondary | ICD-10-CM | POA: Insufficient documentation

## 2016-01-15 DIAGNOSIS — E785 Hyperlipidemia, unspecified: Secondary | ICD-10-CM

## 2016-01-15 DIAGNOSIS — I2510 Atherosclerotic heart disease of native coronary artery without angina pectoris: Secondary | ICD-10-CM | POA: Diagnosis not present

## 2016-01-15 DIAGNOSIS — Z955 Presence of coronary angioplasty implant and graft: Secondary | ICD-10-CM | POA: Insufficient documentation

## 2016-01-15 DIAGNOSIS — Z7902 Long term (current) use of antithrombotics/antiplatelets: Secondary | ICD-10-CM | POA: Insufficient documentation

## 2016-01-15 DIAGNOSIS — G4733 Obstructive sleep apnea (adult) (pediatric): Secondary | ICD-10-CM | POA: Insufficient documentation

## 2016-01-15 DIAGNOSIS — I255 Ischemic cardiomyopathy: Secondary | ICD-10-CM | POA: Diagnosis not present

## 2016-01-15 DIAGNOSIS — I6529 Occlusion and stenosis of unspecified carotid artery: Secondary | ICD-10-CM | POA: Insufficient documentation

## 2016-01-15 DIAGNOSIS — N189 Chronic kidney disease, unspecified: Secondary | ICD-10-CM | POA: Diagnosis not present

## 2016-01-15 DIAGNOSIS — E1122 Type 2 diabetes mellitus with diabetic chronic kidney disease: Secondary | ICD-10-CM | POA: Insufficient documentation

## 2016-01-15 DIAGNOSIS — Z79899 Other long term (current) drug therapy: Secondary | ICD-10-CM | POA: Diagnosis not present

## 2016-01-15 DIAGNOSIS — Z9861 Coronary angioplasty status: Secondary | ICD-10-CM

## 2016-01-15 MED ORDER — ATORVASTATIN CALCIUM 40 MG PO TABS: 40.0000 mg | ORAL_TABLET | Freq: Every day | ORAL | Status: AC

## 2016-01-15 NOTE — Progress Notes (Signed)
Patient ID: Todd Jackson, male   DOB: [birth year], 45 y.o.   MRN: [id number] Referring Physician: Norma FredricksonLori Gerhardt PCP: Micael HampshireKathy Brown  HPI:  Todd Jackson is a 45 y.o. male with chronic systolic CHF (Echo 05/04/15 30-35% Gr 2 DD), Ischemic cardiomyopathy, CAD s/p PCI with stenting of RCA in 2000, CKD, OSA, Obesity, Bipolar disorder, DM2 (diagnosed 1 year ago). EF was 50% via cath in 2000.  Echo 10/2014 showed EF 25%, moderate LAE and mild MR.  Cath 12/15 showed no significant CAD and EF 25%.   Had Medtronic ICD with Optivol implanted 2/16.  Started feeling poorly in January. Falling and feeling weak. Found to be orthostatic. Spironolactone and losartan stopped completely in 6/16.  Was admitted on 05/12/15 with AKI. Creatinine ~1.0 -> 4.5.  He was started on midodrine for autonomic neuropathy. BP improved but was off all HF meds except lasix. Underwent RHC 06/01/15 with normal filling pressures and output. Was getting hypertensive and headaches with tid midodrine.  He then saw a neurologist.  He was thought to have gabapentin-induced toxicity.  Gabapentin was stopped, and his orthostatic symptoms resolved.    No dyspnea on flat ground, no problems with 1 flight of steps.  No lightheadedness with standing currently, but had to cut back Toprol XL to 12.5 mg bid.  Nephrology stopped spironolactone with rise in creatinine (now back to baseline).  No chest pain.  No orthopnea/PND.  He is back at work.  Main complaint recently has been double vision, he has been seeing neurology for this.  He says his eye doctor was concerned about "blood flow problem to the eyes" and wanted carotid dopplers done.  He is using CPAP.  Weight is down 4 lbs.   Optivol: Fluid index < threshold, stable impedance.   Labs (2/16): LDL 56, HDL 27 Labs (10/16): K 4.2, creatinine 1.37 => 1.27, NT-proBNP 136 Labs (3/17): K 4.4, creatinine 0.97, LDL 97, HDL 41  ECHO 05/04/15 EF 30-35%, Grade 2 DD, Diffuse hypokinesis with akinesis of  entire inferolateral/inferior myocardium. Trivial MR  RHC 06/01/15 RA = 5 RV = 37/2/8 PA = 32/9 (21) PCW = 12 Fick cardiac output/index = 6.5/2.6 Thermo cardiac output/index = 6.1/2.5 PVR = 1.5 WU SVR = 1,180  NIBP = 135/89 (101) FA sat = 68%, 71% PA sat = 99%  LHC 10/13/14 LM: Widely patent. LAD: widely patent. LCx: There is only mild disease in the circumflex system. RCA: The stent in the mid vessel is widely patent. There is mild disease in the distal RCA.  EF: 25%, inferior akinesis. LVEDP was 32 mmHg.  Carotid US 11/29/08 Bilateral ICA < 50%   Current Outpatient Prescriptions  Medication Sig Dispense Refill  . aspirin 81 MG tablet Take 1 tablet (81 mg total) by mouth daily.    . clonazePAM (KLONOPIN) 1 MG tablet Take 1 mg by mouth 3 (three) times daily as needed for anxiety.     . clopidogrel (PLAVIX) 75 MG tablet Take 75 mg by mouth daily.    . furosemide (LASIX) 20 MG tablet Take 1 tablet (20 mg total) by mouth daily. 30 tablet 5  . lamoTRIgine (LAMICTAL) 150 MG tablet Take 225 mg by mouth 2 (two) times daily.    Marland Kitchen. levocetirizine (XYZAL) 5 MG tablet Take 5 mg by mouth every evening.     Marland Kitchen. losartan (COZAAR) 25 MG tablet Take 1 tablet (25 mg total) by mouth daily. 30 tablet 6  . metoprolol succinate (TOPROL XL) 25 MG 24 hr tablet  Take 1 tablet (25 mg total) by mouth 2 (two) times daily. (Patient taking differently: Take 12.5 mg by mouth 2 (two) times daily. ) 180 tablet 2  . modafinil (PROVIGIL) 200 MG tablet Take 400 mg by mouth daily. To keep me going per patient    . montelukast (SINGULAIR) 10 MG tablet Take 10 mg by mouth daily as needed.     . nitroGLYCERIN (NITROSTAT) 0.4 MG SL tablet Place 1 tablet (0.4 mg total) under the tongue every 5 (five) minutes as needed for chest pain. 25 tablet 11  . Oxcarbazepine (TRILEPTAL) 300 MG tablet Take 900 mg by mouth 2 (two) times daily.    Marland Kitchen PROAIR HFA 108 (90 BASE) MCG/ACT inhaler Inhale 1-2 puffs into the lungs every 4  (four) hours as needed for wheezing or shortness of breath. Asthma  1  . atorvastatin (LIPITOR) 40 MG tablet Take 1 tablet (40 mg total) by mouth daily. 90 tablet 3   No current facility-administered medications for this encounter.   Filed Vitals:   01/15/16 1122  BP: 118/72  Pulse: 94  Weight: 265 lb (120.203 kg)  SpO2: 99%    PHYSICAL EXAM: General: NAD HEENT: normal Neck: supple. Thick, JVP flat. Carotids 2+ bilat; no bruits. No lymphadenopathy or thryomegaly appreciated. Cor: PMI nondisplaced. Regular rate & rhythm. No rubs, gallops or murmurs. Lungs: CTA Abdomen: Obese, nontender, distended. No hepatosplenomegaly. No bruits or masses. Good bowel sounds. Extremities: no cyanosis, clubbing, rash.  1+ ankle edema bilaterally.  Neuro: alert & oriented x 3, cranial nerves grossly intact. moves all 4 extremities w/o difficulty. Affect pleasant.  ASSESSMENT & PLAN:  1. Chronic systolic CHF:  Ischemic cardiomyopathy.  EF 30-35%, Grade 2 DD, inferolateral akinesis on 8/16 echo.  Ischemic cardiomyopathy. s/p Medtronic ICD. Euvolemic by exam and Optivol today.  Uptitration of meds has been limited by orthostatic symptoms.   - He is off spironolactone now per nephrology with rise in creatinine earlier this year.   - Continue Toprol XL 12.5 mg bid and losartan 25 daily, has not tolerated increases.  - Continue Lasix 20 mg daily.  Recent BMET was ok.  2. CAD: No chest pain.  Continue ASA 81, Plavix.  Recent LDL was above goal (< 70).  Will stop Zocor and start atorvastatin 40 mg daily with lipids/LFTs in 2 months.  3. Orthostatic hypotension: Resolved, but seem to returns with attempts to uptitrate meds.  4. OSA: Using CPAP.  5. Carotid stenosis: Had mild-moderate disease in 2010.  Needs repeat carotid dopplers.    Followup in 3 months.   Enedelia Martorelli,MD 01/15/2016

## 2016-01-15 NOTE — Patient Instructions (Signed)
STOP Zocor.  START Atorvastatin 40 mg once daily.  Will schedule you for [facility] at [facility]. Address: [address], [place], [place] [address]  Phone: [phone]  Return for labs in 2 months.  Follow up in 3 months with Dr. Shirlee LatchMcLean.  Do the following things EVERYDAY: 1) Weigh yourself in the morning before breakfast. Write it down and keep it in a log. 2) Take your medicines as prescribed 3) Eat low salt foods-Limit salt (sodium) to 2000 mg per day.  4) Stay as active as you can everyday 5) Limit all fluids for the day to less than 2 liters

## 2016-01-15 NOTE — Progress Notes (Signed)
Advanced Heart Failure Medication Review by a Pharmacist  Does the patient  feel that his/her medications are working for him/her?  yes  Has the patient been experiencing any side effects to the medications prescribed?  Yes.  Pt states that experiences dizziness but does not know if it is medication related  Does the patient measure his/her own blood pressure or blood glucose at home?  no   Does the patient have any problems obtaining medications due to transportation or finances?   no  Understanding of regimen: good Understanding of indications: good Potential of compliance: good Patient understands to avoid NSAIDs. Patient understands to avoid decongestants.  Issues to address at subsequent visits: None   Pharmacist comments: 45 YO pleasant male presents to HF clinic with his wife.  Pt states that he has been experiencing dizziness but does not know if it is medication related.  Pt has followup with neurology in the next week to address this issue.  Pt requests refills on losartan, metoprolol, and simvastatin.  He takes lasix 20 mg daily and an extra 20 mg PRN (took 2 doses in past week). Pt spironolactone was stopped by urologist/nephrologist per patient.    Time with patient: 10 min  Preparation and documentation time: 5 min  Total time: 15 min

## 2016-01-23 ENCOUNTER — Ambulatory Visit (HOSPITAL_COMMUNITY): Payer: Self-pay | Admitting: Psychiatry

## 2016-01-25 ENCOUNTER — Other Ambulatory Visit (HOSPITAL_COMMUNITY): Payer: Self-pay | Admitting: Psychiatry

## 2016-01-28 ENCOUNTER — Encounter: Payer: Self-pay | Admitting: Internal Medicine

## 2016-01-29 ENCOUNTER — Inpatient Hospital Stay (HOSPITAL_COMMUNITY): Admission: RE | Admit: 2016-01-29 | Payer: Self-pay | Source: Ambulatory Visit

## 2016-01-29 ENCOUNTER — Telehealth: Payer: Self-pay | Admitting: *Deleted

## 2016-01-29 NOTE — Telephone Encounter (Signed)
Spoke to patient regarding ICD shock x 1 on 3/24. Patient stated that he was working really hard at the time of the event. Patient denies any ShOB, chest pain, dizziness, or syncope prior to the episode, but states that he did experience some dizziness and nausea afterwards. Patient stated that he had taken all of his medications as Rx'd.  Plan to talk to Dr.Taylor about the episode and call patient with any further recommendations. Patient voiced understanding.  Patient aware of driving restriction x 6 months.

## 2016-01-30 NOTE — Telephone Encounter (Signed)
Received medication request from Lamictal and Trileptal. Per Dr. Gilmore LarocheAkhtar, medication request is denied. Pt will need to schedule an appt with clinic. LVM for pt to contact clinic to schedule a f/u appt.

## 2016-01-30 NOTE — Telephone Encounter (Signed)
Patient calling wondering if he should be worried because he has not heard anything since yesterday about Dr. Lubertha Basqueaylor's recommendations. I let him know that we typically review episodes with the physician when they are in the office if the patient is stable. Dr. Ladona Ridgelaylor will be in the office tomorrow and I will have him review [name]'s episode first thing and get back to the patient. He verbalizes understanding and is appreciative. He requests a call back on mobile number.

## 2016-01-31 ENCOUNTER — Telehealth: Payer: Self-pay | Admitting: *Deleted

## 2016-01-31 MED ORDER — METOPROLOL SUCCINATE ER 25 MG PO TB24: ORAL_TABLET | ORAL | Status: AC

## 2016-01-31 NOTE — Telephone Encounter (Signed)
Informed Carvel GettingSarah Ashurst (wife) that Dr.Taylor wants patient to take Toprol XL 12.5mg  AM, 25mg  PM (nightly dose increased) due to ICD shock on 3/24. Wife voiced understanding.  New Rx sent in to pharmacy per wife request.

## 2016-02-04 ENCOUNTER — Other Ambulatory Visit: Payer: Self-pay | Admitting: Cardiology

## 2016-02-05 IMAGING — DX DG CHEST 2V
2 series · 2 of 2 positions shown · non-contrast
Comparison: None.

CLINICAL DATA: 43-year-old male status post ICD placement yesterday

EXAM:
CHEST  2 VIEW

[chest pa]
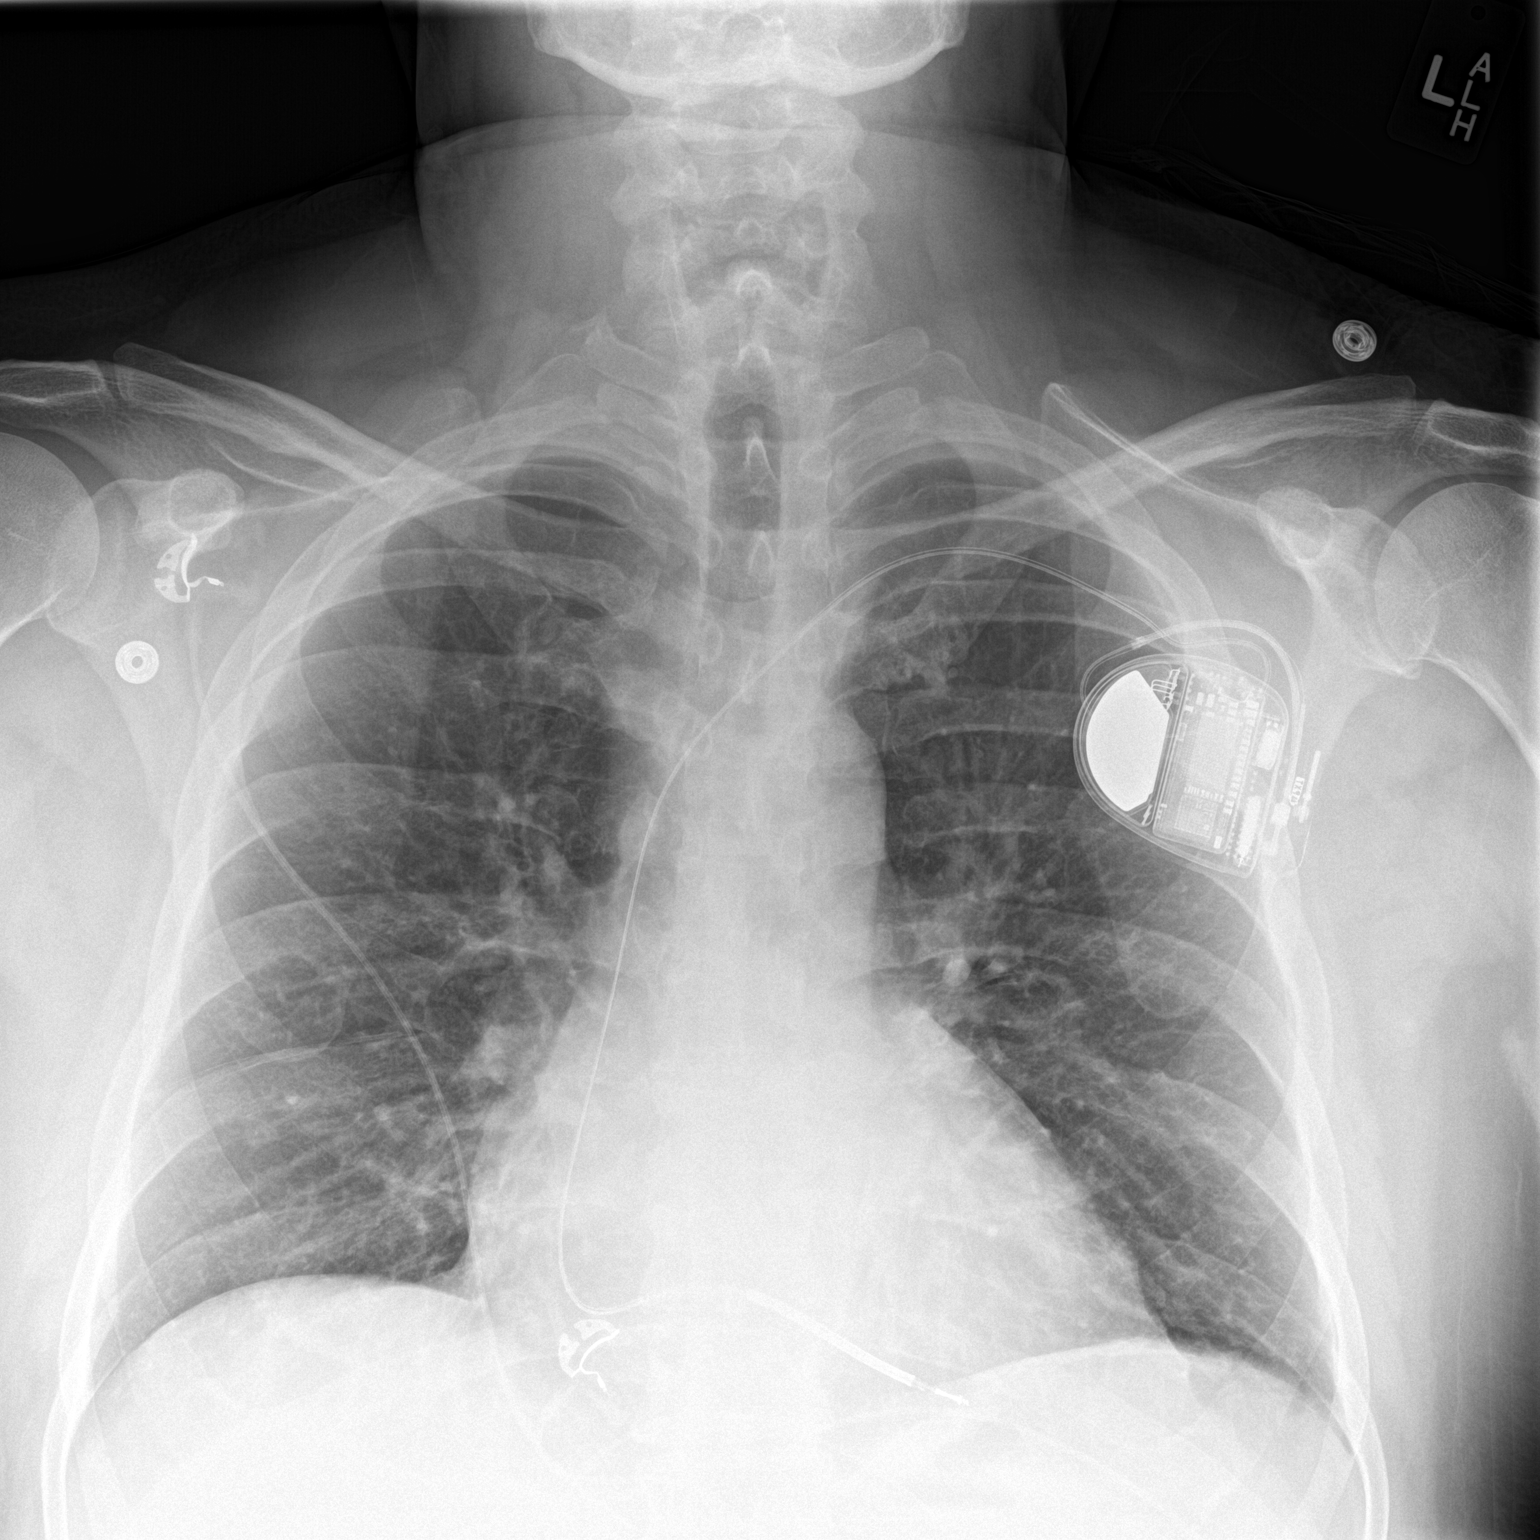

[chest lat]
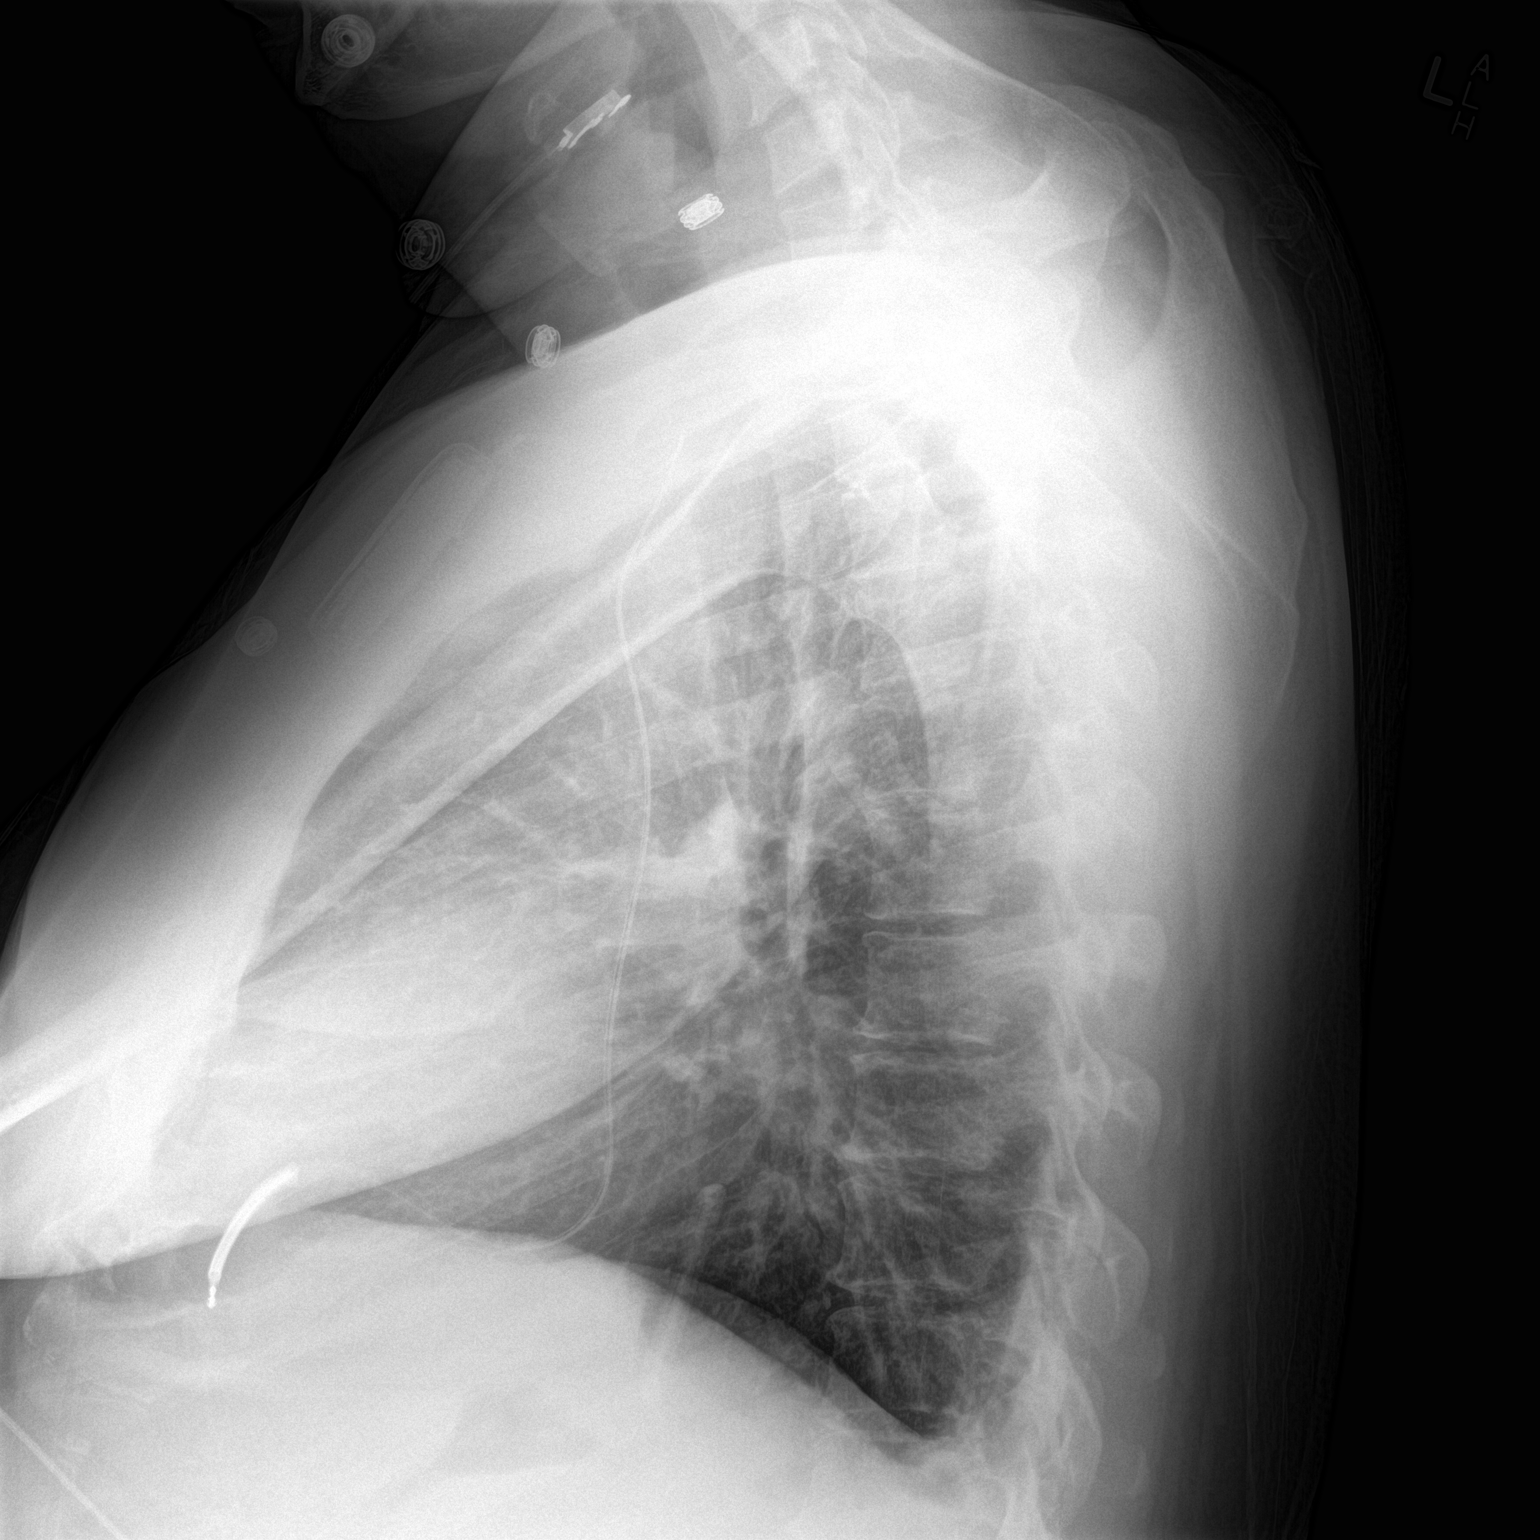

[2 of 2 positions shown; findings below may reference images not displayed]

FINDINGS: Left single lead subclavian internal cardiac defibrillator. The lead
projects over the right ventricular apex. No evidence of
pneumothorax or pleural effusion. Mild cardiomegaly. Mediastinal
contours are within normal limits. No pulmonary edema, focal
airspace consolidation. Mild central bronchitic change and
interstitial coarsening favored to reflect chronic findings. No
overt pulmonary edema. No acute osseous abnormality.
IMPRESSION: 1. Left subclavian approach single lead internal cardiac
defibrillator. The lead overlies the right ventricular apex.
2. No evidence of complication.
3. Cardiomegaly.
4. Central bronchitic change and mild coarsening of the interstitial
markings favored to reflect chronic parenchymal changes.

## 2016-02-06 ENCOUNTER — Ambulatory Visit (INDEPENDENT_AMBULATORY_CARE_PROVIDER_SITE_OTHER): Payer: BLUE CROSS/BLUE SHIELD | Admitting: *Deleted

## 2016-02-06 DIAGNOSIS — Z95810 Presence of automatic (implantable) cardiac defibrillator: Secondary | ICD-10-CM

## 2016-02-06 DIAGNOSIS — I255 Ischemic cardiomyopathy: Secondary | ICD-10-CM

## 2016-02-06 NOTE — Progress Notes (Signed)
Remote ICD transmission.   

## 2016-03-06 ENCOUNTER — Other Ambulatory Visit: Payer: Self-pay | Admitting: Cardiology

## 2016-03-06 NOTE — Telephone Encounter (Signed)
REFILL 

## 2016-03-11 ENCOUNTER — Encounter: Payer: Self-pay | Admitting: Nurse Practitioner

## 2016-03-11 ENCOUNTER — Encounter (HOSPITAL_COMMUNITY): Payer: Self-pay

## 2016-03-15 ENCOUNTER — Encounter (HOSPITAL_COMMUNITY): Payer: Self-pay | Admitting: *Deleted

## 2016-03-15 NOTE — Progress Notes (Signed)
Received sign ROI from [facility] requesting pt's records, records faxed to them at [phone]

## 2016-03-18 ENCOUNTER — Other Ambulatory Visit (HOSPITAL_COMMUNITY): Payer: Self-pay

## 2016-03-18 LAB — CUP PACEART REMOTE DEVICE CHECK
Battery Remaining Longevity: 130 mo
Battery Voltage: 3.01 V
Brady Statistic RV Percent Paced: 0 %
Date Time Interrogation Session: 20170404052306
HIGH POWER IMPEDANCE MEASURED VALUE: 86 Ohm
Implantable Lead Serial Number: 330890
Lead Channel Impedance Value: 551 Ohm
Lead Channel Impedance Value: 589 Ohm
Lead Channel Sensing Intrinsic Amplitude: 27.125 mV
Lead Channel Setting Pacing Amplitude: 2 V
Lead Channel Setting Pacing Pulse Width: 0.4 ms
Lead Channel Setting Sensing Sensitivity: 0.3 mV
MDC IDC LEAD IMPLANT DT: 20160205
MDC IDC LEAD LOCATION: 753860
MDC IDC LEAD MODEL: 181
MDC IDC MSMT LEADCHNL RV PACING THRESHOLD AMPLITUDE: 0.75 V
MDC IDC MSMT LEADCHNL RV PACING THRESHOLD PULSEWIDTH: 0.4 ms
MDC IDC MSMT LEADCHNL RV SENSING INTR AMPL: 27.125 mV

## 2016-03-19 ENCOUNTER — Encounter: Payer: Self-pay | Admitting: Cardiology

## 2016-03-25 ENCOUNTER — Telehealth: Payer: Self-pay | Admitting: Cardiology

## 2016-03-25 NOTE — Telephone Encounter (Signed)
Spoke w/ nurse at [facility] and she stated that pt is transferring his career to [facility] but she was unable to request a transfer in carelink. Pt is currently co managed and has to be released by the device clinic and the CHF clinic. Sent a staff message to RN at the CHF clinic. Informed nurse at [facility] that I would let her know when we are able to transfer his care. She verbalized understanding.

## 2016-03-27 ENCOUNTER — Telehealth (HOSPITAL_COMMUNITY): Payer: Self-pay | Admitting: Surgery

## 2016-03-27 NOTE — Telephone Encounter (Signed)
Received a call from device clinic in reference to "releasing" patient as he is now receiving care at [facility].  I will forward to [employer]ather Schub--Clinic Coordinator to access website in order to release patient.

## 2016-04-05 NOTE — Telephone Encounter (Signed)
This was done 5/30. 

## 2016-04-05 NOTE — Telephone Encounter (Signed)
Late entry pt was released to [facility] in [facility].

## 2016-04-19 ENCOUNTER — Telehealth (HOSPITAL_COMMUNITY): Payer: Self-pay | Admitting: Vascular Surgery

## 2016-04-19 NOTE — Telephone Encounter (Signed)
Pt wife called to cancel pt appt for 04/22/16 , [facility] now on

## 2016-04-22 ENCOUNTER — Encounter (HOSPITAL_COMMUNITY): Payer: Self-pay

## 2016-04-29 ENCOUNTER — Other Ambulatory Visit: Payer: Self-pay | Admitting: *Deleted

## 2016-04-29 ENCOUNTER — Other Ambulatory Visit: Payer: Self-pay | Admitting: Cardiology

## 2016-04-29 NOTE — Telephone Encounter (Signed)
Rx(s) sent to pharmacy electronically.  

## 2016-04-29 NOTE — Telephone Encounter (Signed)
losartan (COZAAR) 25 MG tablet  Medication   Date: 07/25/2015  Department: [facility]  Ordering/Authorizing: Laurey Moralealton S McLean, MD      Order Providers    Prescribing Provider Encounter Provider   Laurey Moralealton S McLean, MD [id number]    Medication Detail      Disp Refills Start End     losartan (COZAAR) 25 MG tablet 30 tablet 6 07/25/2015     Sig - Route: Take 1 tablet (25 mg total) by mouth daily. - Oral    E-Prescribing Status: Receipt confirmed by pharmacy (07/25/2015 2:40 PM EDT)     Pharmacy    [facility] [id number] - [place], [place] - [address] AT [facility] [address]     Will route to CHF

## 2016-04-30 MED ORDER — LOSARTAN POTASSIUM 25 MG PO TABS: 25.0000 mg | ORAL_TABLET | Freq: Every day | ORAL | Status: AC

## 2016-07-07 IMAGING — DX DG CHEST 2V
2 series · 2 of 2 positions shown · non-contrast
Comparison: Chest radiograph March 23, 2015; chest CT March 23, 2015

CLINICAL DATA: Hypertension and syncope

EXAM:
CHEST  2 VIEW

[chest pa]
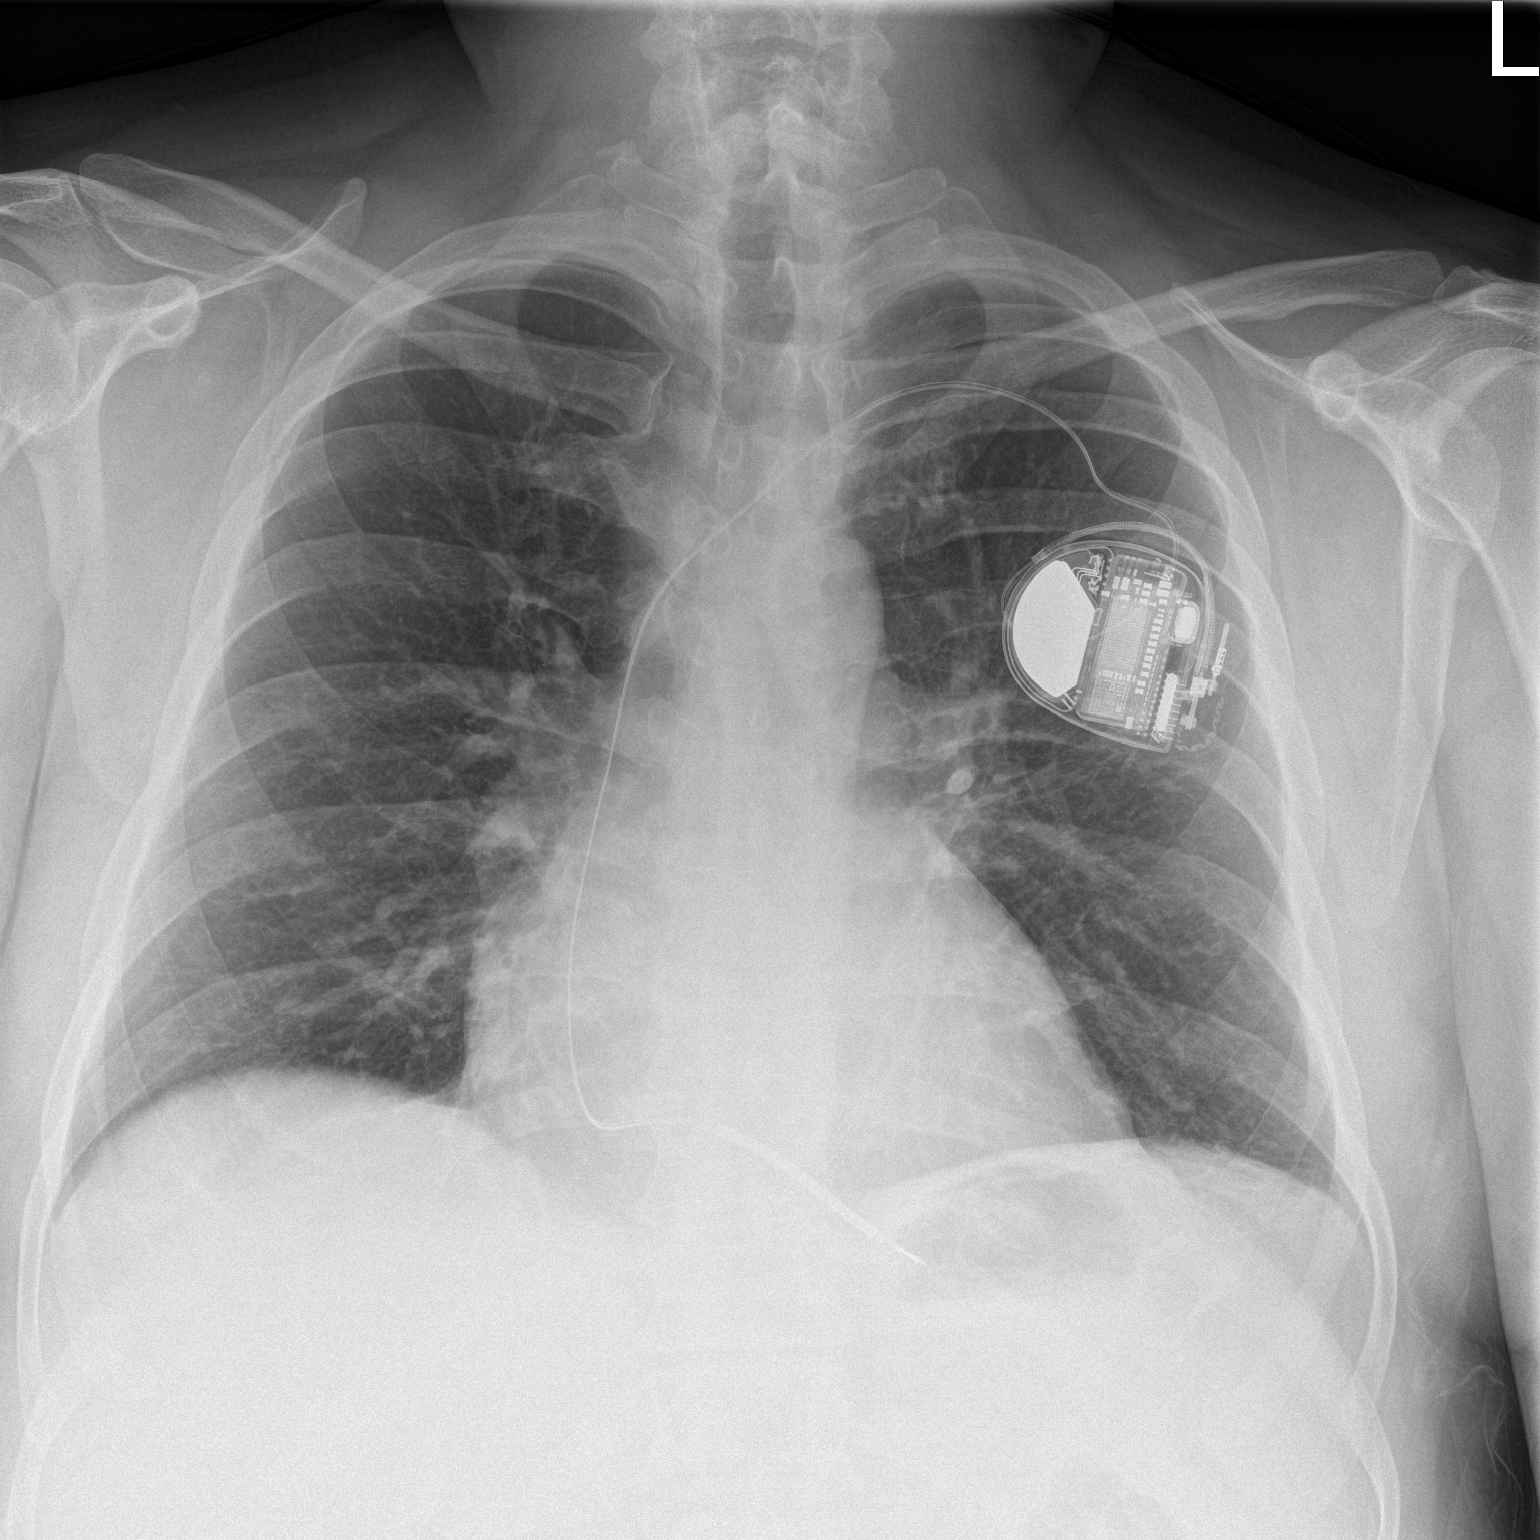

[chest lat]
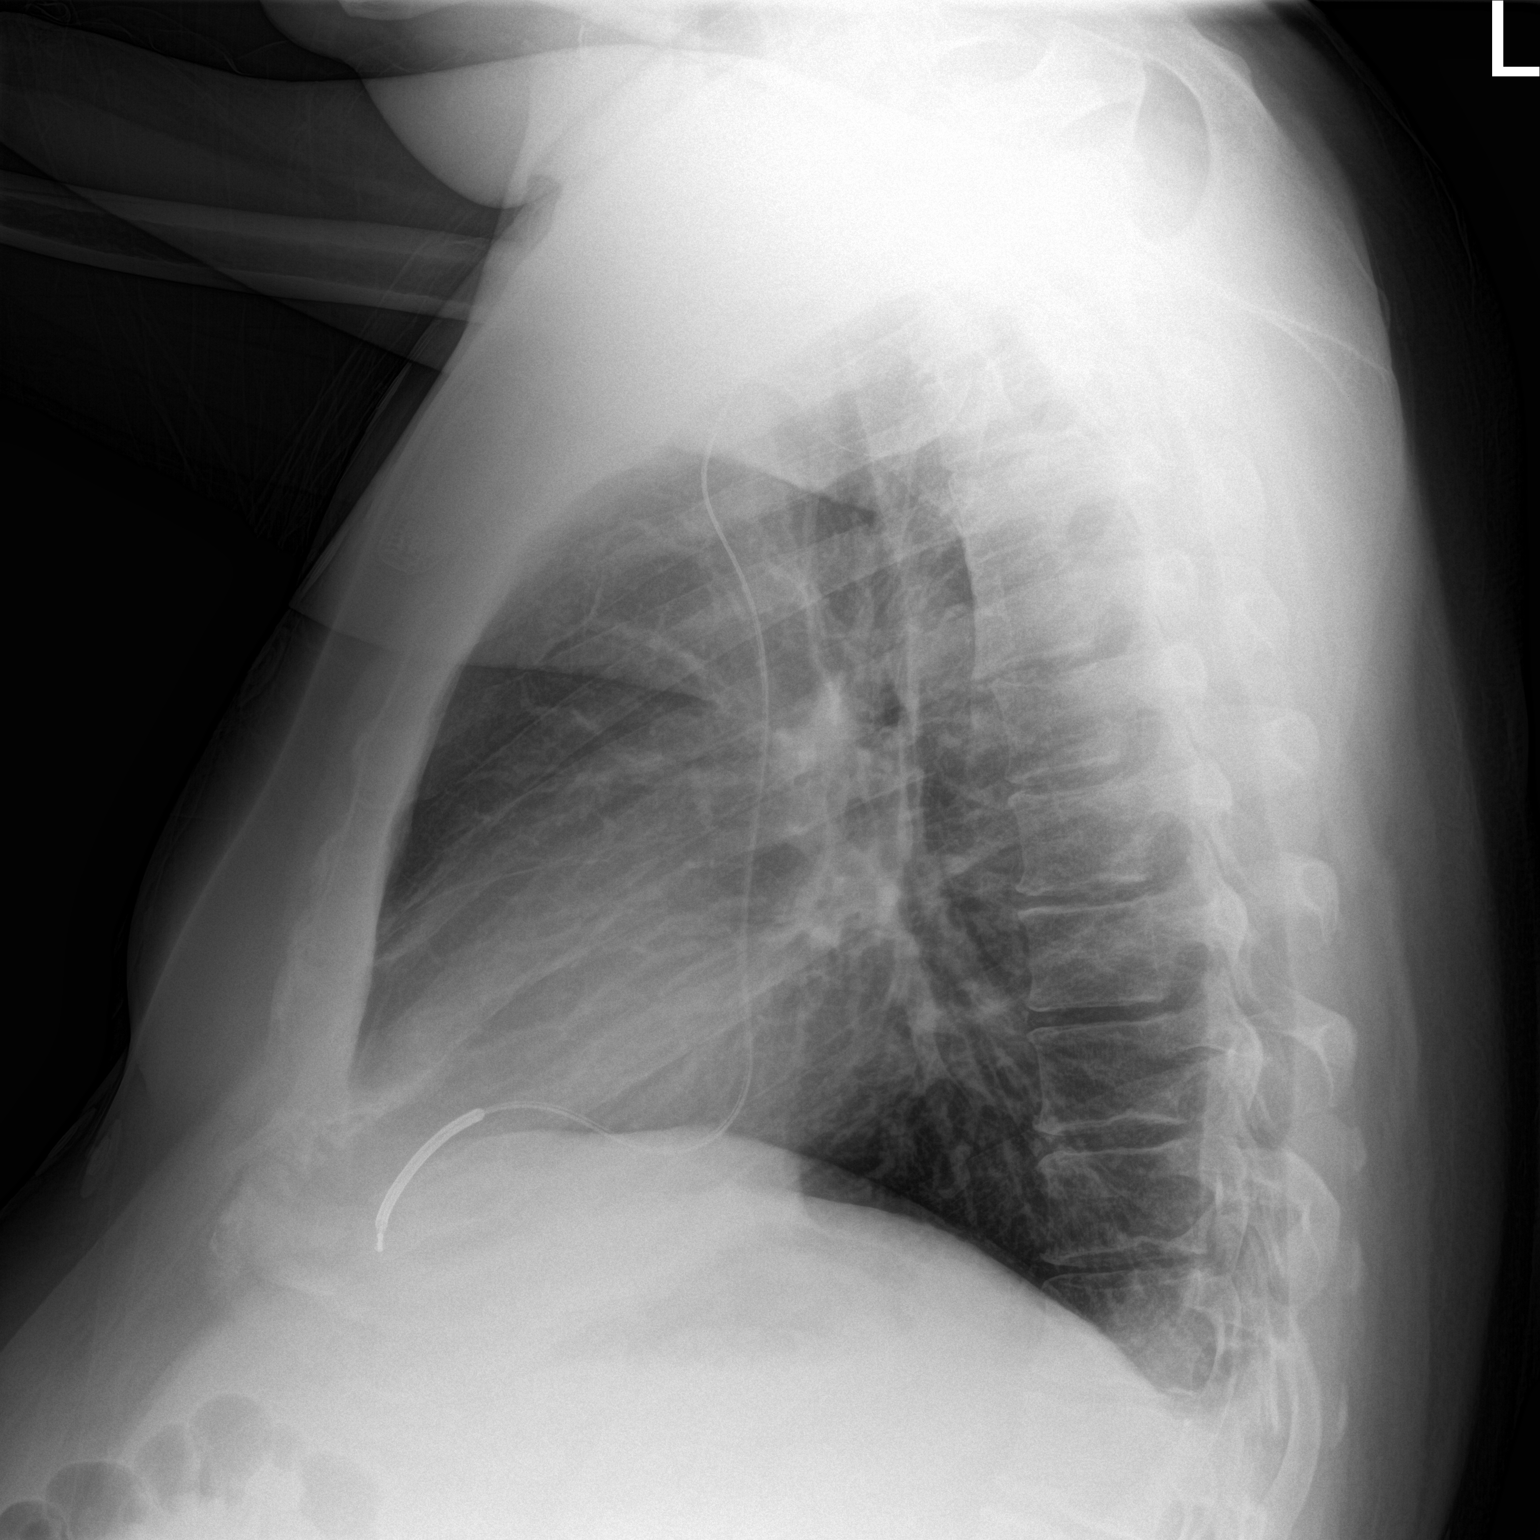

[2 of 2 positions shown; findings below may reference images not displayed]

FINDINGS: There is no edema or consolidation. Heart size and pulmonary
vascularity are normal. No adenopathy. Pacemaker present with lead
tip attached to the left ventricle. No bone lesions.
IMPRESSION: No edema or consolidation.

## 2016-12-13 IMAGING — US US RENAL
1 series · 14 of 25 positions shown · non-contrast
Comparison: None.

CLINICAL DATA: 44-year-old male with acute renal failure.

EXAM:
RENAL / URINARY TRACT ULTRASOUND COMPLETE

[Series 1: us renal · 0.24mm/px · 14 of 27 slices shown]
[im 1/27]
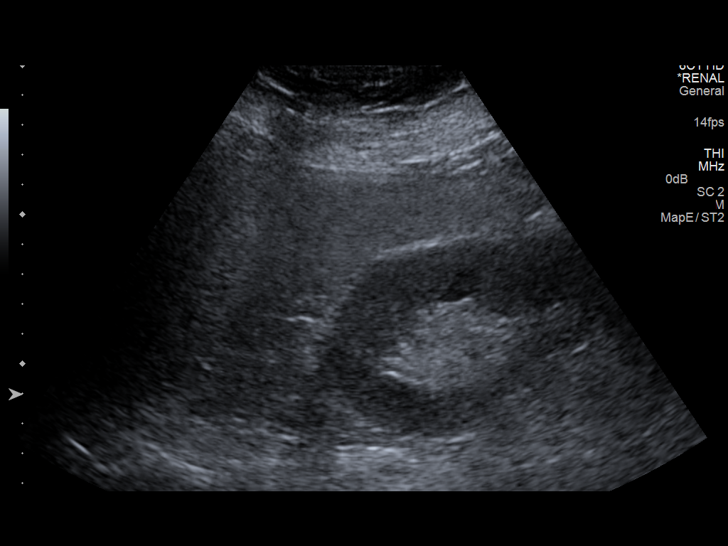
[im 3/27]
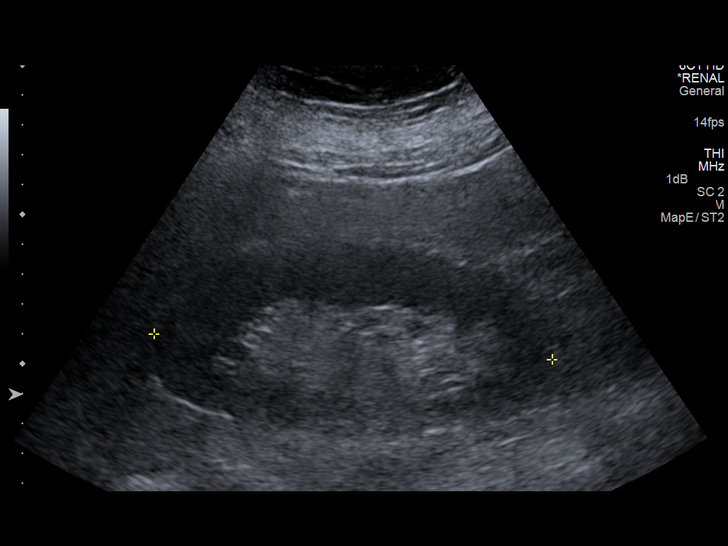
[im 5/27]
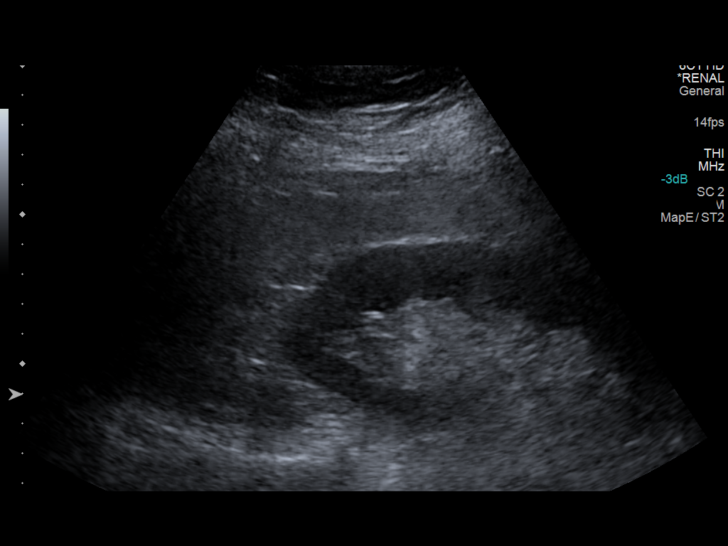
[im 7/27]
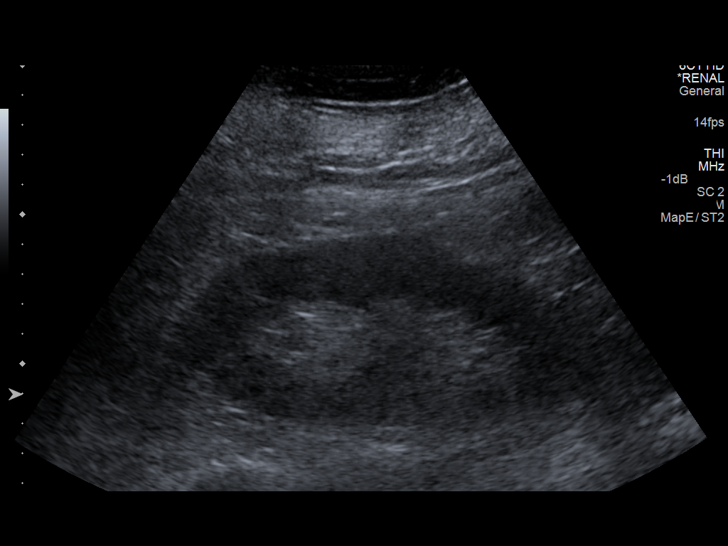
[im 9/27]
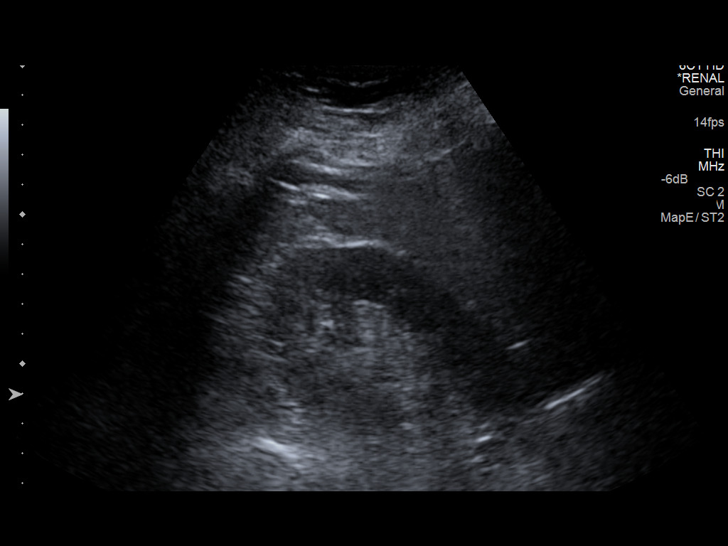
[im 10/27]
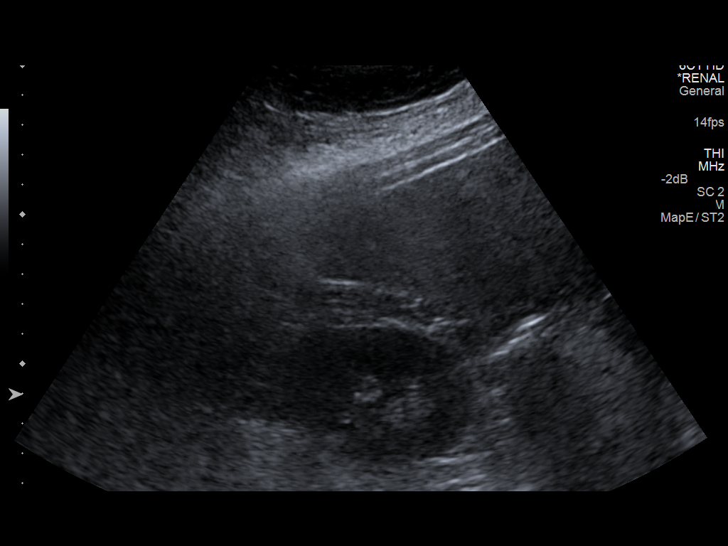
[im 12/27]
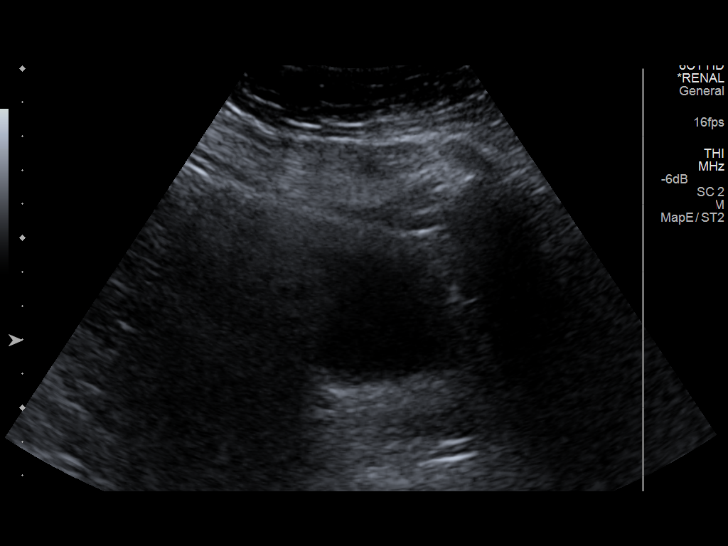
[im 15/27]
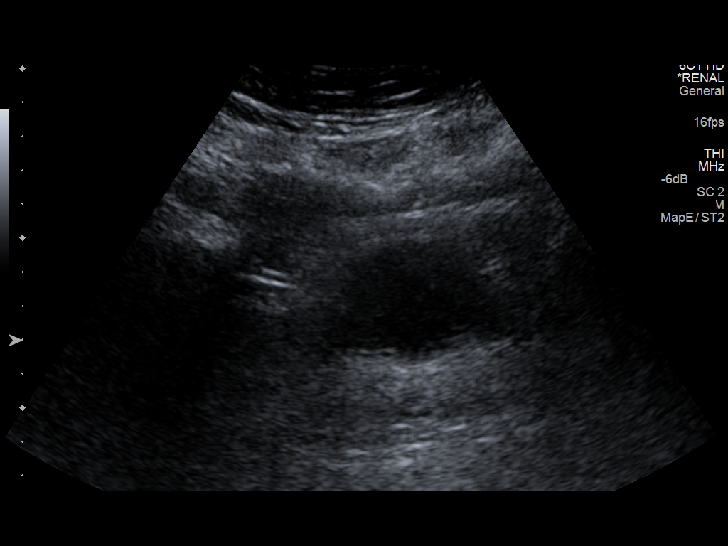
[im 17/27]
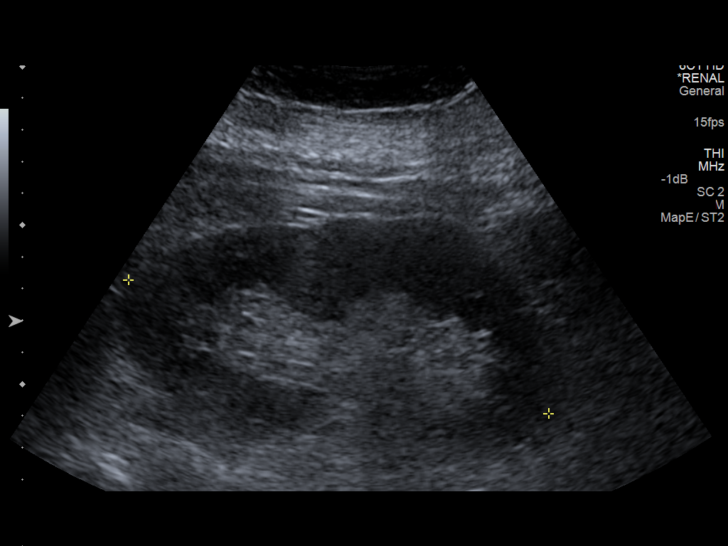
[im 18/27]
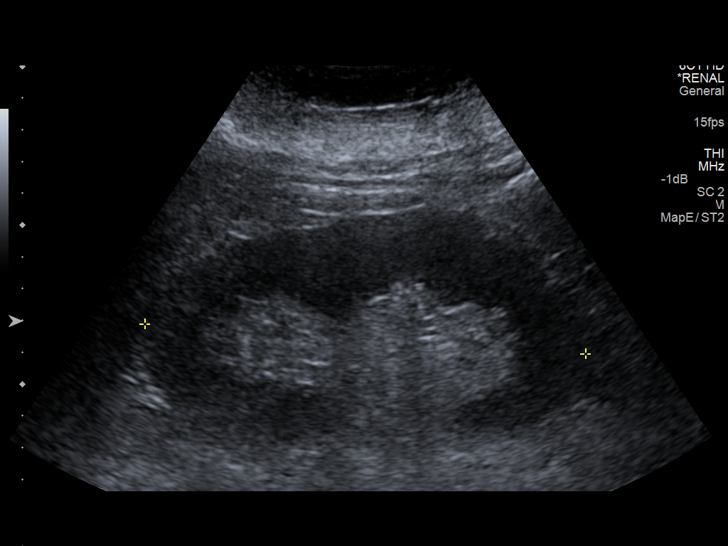
[im 20/27]
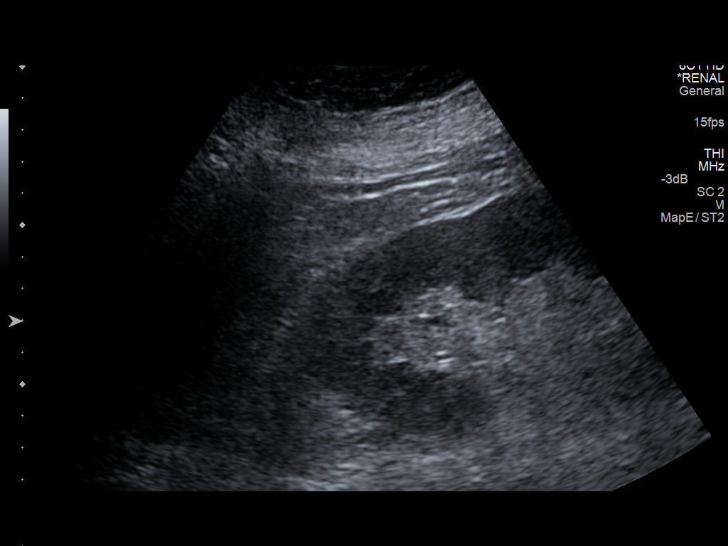
[im 22/27]
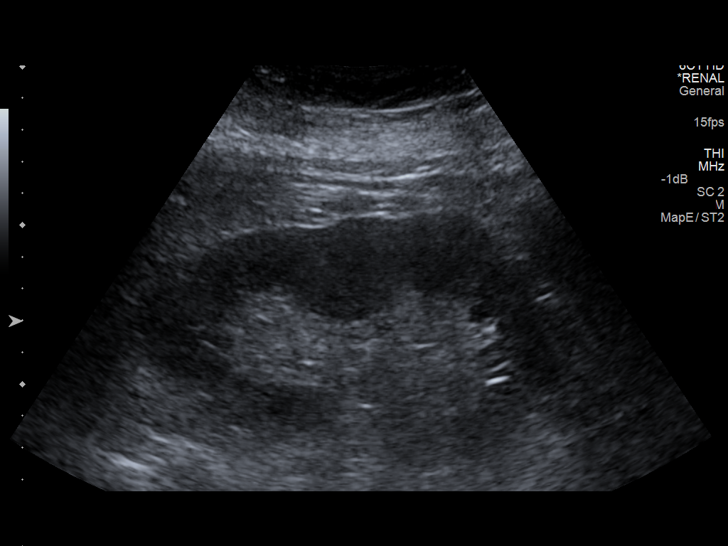
[im 24/27]
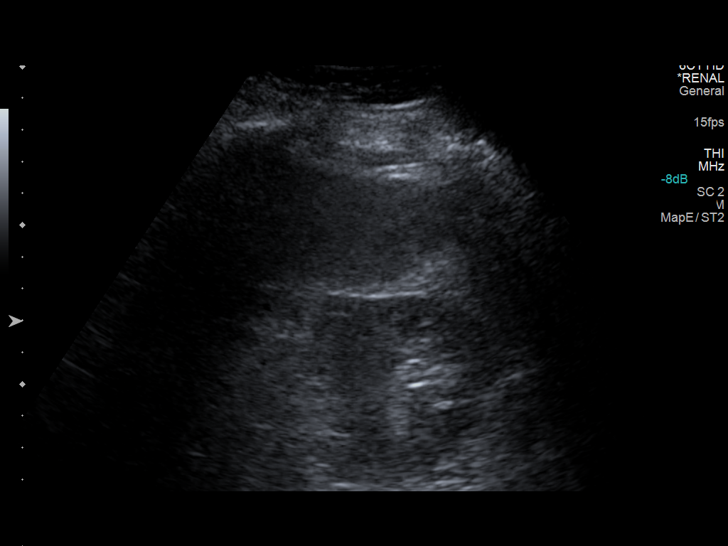
[im 27/27]
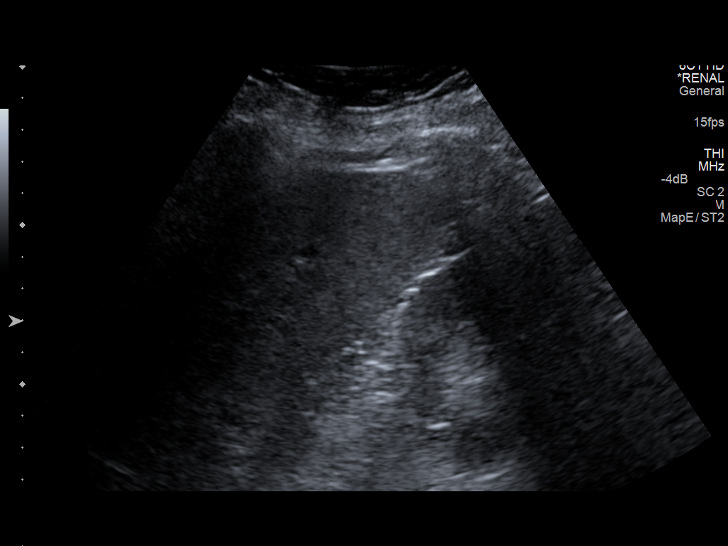

[14 of 25 positions shown; findings below may reference images not displayed]

FINDINGS: Right Kidney:

Length: 13.8 cm. Echogenicity within normal limits. No mass or
hydronephrosis visualized.

Left Kidney:

Length: 13.9 cm. Echogenicity within normal limits. No mass or
hydronephrosis visualized.

Bladder:

Appears normal for degree of bladder distention.
IMPRESSION: Normal renal ultrasound.

## 2016-12-27 ENCOUNTER — Encounter: Payer: Self-pay | Admitting: Cardiology

## 2017-01-03 ENCOUNTER — Encounter (HOSPITAL_COMMUNITY): Payer: Self-pay
